# Patient Record
Sex: Female | Born: 1965 | Race: White | Hispanic: No | Marital: Married | Smoking: Never smoker
Health system: Southern US, Community
[De-identification: ages and names within clinical notes are randomized; demographics above are authoritative.]

## PROBLEM LIST (undated history)

## (undated) DIAGNOSIS — Z9889 Other specified postprocedural states: Secondary | ICD-10-CM

## (undated) DIAGNOSIS — E785 Hyperlipidemia, unspecified: Secondary | ICD-10-CM

## (undated) DIAGNOSIS — J302 Other seasonal allergic rhinitis: Secondary | ICD-10-CM

## (undated) DIAGNOSIS — I10 Essential (primary) hypertension: Secondary | ICD-10-CM

## (undated) DIAGNOSIS — G459 Transient cerebral ischemic attack, unspecified: Secondary | ICD-10-CM

## (undated) DIAGNOSIS — F419 Anxiety disorder, unspecified: Secondary | ICD-10-CM

## (undated) DIAGNOSIS — K219 Gastro-esophageal reflux disease without esophagitis: Secondary | ICD-10-CM

## (undated) DIAGNOSIS — E669 Obesity, unspecified: Secondary | ICD-10-CM

## (undated) HISTORY — DX: Anxiety disorder, unspecified: F41.9

## (undated) HISTORY — DX: Gastro-esophageal reflux disease without esophagitis: K21.9

## (undated) HISTORY — DX: Hyperlipidemia, unspecified: E78.5

## (undated) HISTORY — PX: OTHER SURGICAL HISTORY: SHX169

## (undated) HISTORY — DX: Transient cerebral ischemic attack, unspecified: G45.9

## (undated) HISTORY — PX: TONSILLECTOMY: SUR1361

## (undated) HISTORY — DX: Obesity, unspecified: E66.9

---
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
## 2000-10-07 ENCOUNTER — Encounter (INDEPENDENT_AMBULATORY_CARE_PROVIDER_SITE_OTHER): Payer: Self-pay

## 2000-10-07 ENCOUNTER — Other Ambulatory Visit: Admission: RE | Admit: 2000-10-07 | Discharge: 2000-10-07 | Payer: Self-pay | Admitting: Obstetrics and Gynecology

## 2000-10-25 ENCOUNTER — Ambulatory Visit (HOSPITAL_COMMUNITY): Admission: RE | Admit: 2000-10-25 | Discharge: 2000-10-25 | Payer: Self-pay | Admitting: Family Medicine

## 2000-10-25 ENCOUNTER — Encounter: Payer: Self-pay | Admitting: Family Medicine

## 2000-11-07 ENCOUNTER — Encounter (INDEPENDENT_AMBULATORY_CARE_PROVIDER_SITE_OTHER): Payer: Self-pay | Admitting: Specialist

## 2000-11-07 ENCOUNTER — Observation Stay (HOSPITAL_COMMUNITY): Admission: RE | Admit: 2000-11-07 | Discharge: 2000-11-08 | Payer: Self-pay | Admitting: Obstetrics and Gynecology

## 2001-06-19 ENCOUNTER — Encounter: Payer: Self-pay | Admitting: Internal Medicine

## 2001-06-19 ENCOUNTER — Ambulatory Visit (HOSPITAL_COMMUNITY): Admission: RE | Admit: 2001-06-19 | Discharge: 2001-06-19 | Payer: Self-pay | Admitting: Internal Medicine

## 2001-12-01 ENCOUNTER — Other Ambulatory Visit: Admission: RE | Admit: 2001-12-01 | Discharge: 2001-12-01 | Payer: Self-pay | Admitting: Obstetrics and Gynecology

## 2002-12-13 ENCOUNTER — Other Ambulatory Visit: Admission: RE | Admit: 2002-12-13 | Discharge: 2002-12-13 | Payer: Self-pay | Admitting: Obstetrics and Gynecology

## 2003-01-16 ENCOUNTER — Emergency Department (HOSPITAL_COMMUNITY): Admission: EM | Admit: 2003-01-16 | Discharge: 2003-01-16 | Payer: Self-pay | Admitting: Emergency Medicine

## 2003-01-16 ENCOUNTER — Encounter: Payer: Self-pay | Admitting: Emergency Medicine

## 2003-01-17 ENCOUNTER — Encounter: Admission: RE | Admit: 2003-01-17 | Discharge: 2003-01-17 | Payer: Self-pay | Admitting: Obstetrics and Gynecology

## 2003-01-17 ENCOUNTER — Encounter: Payer: Self-pay | Admitting: Obstetrics and Gynecology

## 2003-01-24 ENCOUNTER — Encounter: Payer: Self-pay | Admitting: Family Medicine

## 2003-01-24 ENCOUNTER — Ambulatory Visit (HOSPITAL_COMMUNITY): Admission: RE | Admit: 2003-01-24 | Discharge: 2003-01-24 | Payer: Self-pay | Admitting: Family Medicine

## 2003-11-07 ENCOUNTER — Ambulatory Visit (HOSPITAL_COMMUNITY): Admission: RE | Admit: 2003-11-07 | Discharge: 2003-11-07 | Payer: Self-pay | Admitting: Family Medicine

## 2004-02-17 ENCOUNTER — Ambulatory Visit (HOSPITAL_COMMUNITY): Admission: RE | Admit: 2004-02-17 | Discharge: 2004-02-17 | Payer: Self-pay | Admitting: Obstetrics and Gynecology

## 2004-04-12 HISTORY — PX: ABDOMINAL HYSTERECTOMY: SHX81

## 2004-06-22 ENCOUNTER — Ambulatory Visit (HOSPITAL_COMMUNITY): Admission: RE | Admit: 2004-06-22 | Discharge: 2004-06-22 | Payer: Self-pay | Admitting: Family Medicine

## 2004-06-22 ENCOUNTER — Encounter: Payer: Self-pay | Admitting: Orthopedic Surgery

## 2004-10-14 ENCOUNTER — Emergency Department (HOSPITAL_COMMUNITY): Admission: EM | Admit: 2004-10-14 | Discharge: 2004-10-14 | Payer: Self-pay | Admitting: Emergency Medicine

## 2004-12-04 ENCOUNTER — Ambulatory Visit (HOSPITAL_COMMUNITY): Admission: RE | Admit: 2004-12-04 | Discharge: 2004-12-04 | Payer: Self-pay | Admitting: Family Medicine

## 2005-02-23 ENCOUNTER — Ambulatory Visit (HOSPITAL_COMMUNITY): Admission: RE | Admit: 2005-02-23 | Discharge: 2005-02-23 | Payer: Self-pay | Admitting: Obstetrics and Gynecology

## 2006-02-01 ENCOUNTER — Emergency Department (HOSPITAL_COMMUNITY): Admission: EM | Admit: 2006-02-01 | Discharge: 2006-02-01 | Payer: Self-pay | Admitting: Emergency Medicine

## 2006-02-04 ENCOUNTER — Ambulatory Visit: Payer: Self-pay | Admitting: Internal Medicine

## 2006-02-09 ENCOUNTER — Ambulatory Visit (HOSPITAL_COMMUNITY): Admission: RE | Admit: 2006-02-09 | Discharge: 2006-02-09 | Payer: Self-pay | Admitting: Internal Medicine

## 2006-03-08 ENCOUNTER — Ambulatory Visit (HOSPITAL_COMMUNITY): Admission: RE | Admit: 2006-03-08 | Discharge: 2006-03-08 | Payer: Self-pay | Admitting: Family Medicine

## 2006-04-12 DIAGNOSIS — Z9889 Other specified postprocedural states: Secondary | ICD-10-CM

## 2006-04-12 HISTORY — DX: Other specified postprocedural states: Z98.890

## 2006-06-10 ENCOUNTER — Inpatient Hospital Stay (HOSPITAL_COMMUNITY): Admission: EM | Admit: 2006-06-10 | Discharge: 2006-06-14 | Payer: Self-pay | Admitting: Emergency Medicine

## 2006-06-13 ENCOUNTER — Encounter: Payer: Self-pay | Admitting: Cardiovascular Disease

## 2006-06-13 HISTORY — PX: CARDIAC CATHETERIZATION: SHX172

## 2007-03-23 ENCOUNTER — Ambulatory Visit (HOSPITAL_COMMUNITY): Admission: RE | Admit: 2007-03-23 | Discharge: 2007-03-23 | Payer: Self-pay | Admitting: Obstetrics and Gynecology

## 2007-05-15 ENCOUNTER — Ambulatory Visit: Payer: Self-pay | Admitting: Orthopedic Surgery

## 2007-05-15 DIAGNOSIS — M25569 Pain in unspecified knee: Secondary | ICD-10-CM

## 2008-08-06 ENCOUNTER — Ambulatory Visit (HOSPITAL_COMMUNITY): Admission: RE | Admit: 2008-08-06 | Discharge: 2008-08-06 | Payer: Self-pay | Admitting: Obstetrics and Gynecology

## 2009-06-10 ENCOUNTER — Encounter: Admission: RE | Admit: 2009-06-10 | Discharge: 2009-06-10 | Payer: Self-pay | Admitting: Otolaryngology

## 2010-01-21 ENCOUNTER — Ambulatory Visit (HOSPITAL_COMMUNITY): Admission: RE | Admit: 2010-01-21 | Discharge: 2010-01-21 | Payer: Self-pay | Admitting: Family Medicine

## 2010-05-03 ENCOUNTER — Encounter: Payer: Self-pay | Admitting: Family Medicine

## 2010-05-03 ENCOUNTER — Encounter: Payer: Self-pay | Admitting: Obstetrics and Gynecology

## 2010-08-28 NOTE — Cardiovascular Report (Signed)
NAMEMARTYNA, THORNS NO.:  [id number]   MEDICAL RECORD NO.:  [id number]          PATIENT TYPE:  INP   LOCATION:  2003                         FACILITY:  [facility]   PHYSICIAN:  Nicki Guadalajara, M.D.     DATE OF BIRTH:  [birth year]   DATE OF PROCEDURE:  DATE OF DISCHARGE:                            CARDIAC CATHETERIZATION   INDICATIONS:  Leah Henson is a 45 year old female who has a  history of obesity who was admitted in transfer from [facility]  with recurrent chest pressure, diaphoresis, and transient hypotension  which was nitrate responsive.  Cardiac enzymes have been negative.  Definitive diagnostic cardiac catheterization was recommended.   PROCEDURE:  After premedication with Versed 3 mg intravenously, the  patient was prepped and draped in the usual fashion.  Her right femoral  artery was punctured anteriorly, and a 5-French sheath was inserted  without difficulty.  Diagnostic catheterization was done utilizing 5-  J[place]ica Judkins for left and right coronary catheters.  A pigtail  catheter was used for RAO ventriculography as well as distal  aortography.  Hemostasis was obtained by direct manual pressure.  The  patient tolerated the procedure well and returned to her room in  satisfactory condition.   HEMODYNAMIC DATA:  Central aortic pressure is 115/74, mean 94, left  ventricular pressure 115/6, post A-wave 13.   ANGIOGRAPHIC DATA:  Left main coronary was angiographically normal and  bifurcated into an LAD and left circumflex system.   The LAD was angiographically normal, gave rise to several septal  perforating arteries and several small diagonal vessels.   The left circumflex coronary artery was angiographically normal, gave  rise to one major marginal vessel, and was free of significant disease.   The right coronary artery was a large-caliber dominant vessel that  supplied a large PDA and large posterolateral system.  The RCA and  its  branches were normal.   RAO ventriculography revealed hyperdynamic LV function, with ejection  fraction greater than 65%.  There were no focal segmental wall motion  abnormalities.   Distal aortography revealed a normal aortoiliac system.  There is no  evidence for renal artery stenosis.   IMPRESSION:  1. Normal left ventricular function.  2. Normal coronary arteries.   DISCUSSION:  [name] was admitted to [facility] on  June 10, 2006 with left chest, jaw pain radiating to the medial  aspect of her left arm.  She developed recurrent chest pain on Sunday  June 12, 2006, leading to her [facility] transfer.  Catheterization reveals normal coronary arteries.  There is some history  of possible gallbladder issues.  A gallbladder ultrasound will be  obtained prior to possible discharge later today.           ______________________________  Nicki Guadalajara, M.D.     TK/MEDQ  D:  06/13/2006  T:  06/13/2006  Job:  [id number]   cc:   Kirk Ruths, M.D.  Patrica Duel, M.D.

## 2010-08-28 NOTE — H&P (Signed)
Leah Henson, Leah Henson            ACCOUNT NO.:  [id number]   MEDICAL RECORD NO.:  [id number]          PATIENT TYPE:  INP   LOCATION:  2003                         FACILITY:  [facility]   PHYSICIAN:  Abelino Derrick, P.A.   DATE OF BIRTH:  [birth year]   DATE OF ADMISSION:  06/12/2006  DATE OF DISCHARGE:                              HISTORY & PHYSICAL   CHIEF COMPLAINT:  Chest pain.   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  She had seen Dr. gamble  over 10 years ago.  There is no documented history of prior cardiac  testing.  The patient was in her usual state of health until Friday,  when she developed some chest tightness that went through left shoulder  down her left elbow.  This was Friday afternoon about 2:30.  She took  some aspirin and her symptoms eventually subsided.  Friday night, she  had recurrent pain that was more severe associated with shortness of  breath.  She presented to [facility] ER.  Initial enzymes were negative.  D-dimer was normal.  CT scan of her chest was negative for pulmonary  embolism.  EKG was without acute changes.  She was admitted overnight  for observation.  Plan was to let her go home today, and she was doing  better, but she has had recurrent chest pain.  Her pain now is nitrate  responsive.  She did have some hypotension.  After having chest pain  with nitro this morning she is not transferred to [facility] for further  evaluation.   PAST MEDICAL HISTORY:  Remarkable for previous total abdominal  hysterectomy.  She has had a history of some anxiety and is on Zoloft.  She has had no other major surgeries.  There is no history of  hypertension, diabetes or dyslipidemia.   CURRENT MEDICATIONS:  Are on Zoloft 50 mg a day and an over-the-counter  or hormone preparation.   ALLERGIES:  SHE IS INTOLERANT TO  CODEINE.   SOCIAL HISTORY:  She is married.  She is two children 72 and 57 years  old.  She works as an Psychiatric nurse.  She is a nonsmoker.   FAMILY HISTORY:  Is remarkable for coronary disease, her father had an  MI at 56 and died at 85.  Her mother has history of aortic stenosis.  She has two sisters and a brother.  Neither have coronary disease.   REVIEW OF SYSTEMS:  Essentially unremarkable except for above.  She did  mention some recent weight gain, although she says her TSH has  previously been  normal.   PHYSICAL EXAM:  VITAL SIGNS:  Blood pressure 95/60, pulse 84,  temperature 97, respirations 12.  GENERAL:  She is a well-developed, well-nourished female in no acute  distress.  HEENT:  Normocephalic.  Extraocular movements are intact.  Sclerae is  nonicteric.  Lids and conjunctivae are within normal limits.  NECK:  Without JVD or bruit.  CHEST:  Clear  to auscultation and percussion.  CARDIAC:  Exam reveals a regular rate and rhythm without murmur, rub or  gallop.  Normal S1, S2.  ABDOMEN:  Nontender, no hepatosplenomegaly.  Bowel sounds are present.  EXTREMITIES:  Without edema.  Distal pulses are intact.  NEURO:  Exam grossly intact.  She is awake, alert, oriented and  cooperative, moves all extremities without obvious deficit.  SKIN:  Warm and dry.   IMPRESSION:  1. Chest pain consistent with unstable angina.  2. Transient hypotension this morning.  3. Family history of coronary disease.  4. History of anxiety.   PLAN:  The patient was admitted to telemetry.  Will continue full-dose  Lovenox and aspirin and added PPI and a short course of nonsteroidal  anti-inflammatory.  She will be set up for diagnostic catheterization  tomorrow.      Abelino Derrick, P.Memory Dance  D:  06/12/2006  T:  06/12/2006  Job:  [id number]

## 2010-08-28 NOTE — H&P (Signed)
Leah Henson, Leah Henson            ACCOUNT NO.:  [id number]   MEDICAL RECORD NO.:  [id number]          PATIENT TYPE:  INP   LOCATION:  A208                          FACILITY:  [facility]   PHYSICIAN:  Patrica Duel, M.D.    DATE OF BIRTH:  12-17-1965   DATE OF ADMISSION:  06/10/2006  DATE OF DISCHARGE:  LH                              HISTORY & PHYSICAL   CHIEF COMPLAINT:  Chest pain.   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  She is a nonsmoker, nondrinker  and leads a healthy lifestyle.   The patient presented to the emergency department with the acute onset  of left anterior chest pain with some radiation to the left scapula,  neck and left arm with swelling of the third and fourth digits of the  left hand.  She had no palpitations, syncope, significant dyspnea,  nausea or vomiting.   Emergency department evaluation was benign.  EKG was nonacute.  Cardiac  enzymes were negative, and chest x-ray revealed no acute cardiopulmonary  disease.  D-dimer was normal at 0.23.   The patient was admitted for chest pain of questionable etiology.   Upon further questioning, the patient states she has had a cold  manifested by nasal congestion and some cough but no rigor or dyspnea.  She has been treating herself with over-the-counter medications and has  improved.   Currently, the patient denies significant cough, hemoptysis, headache,  neurologic deficits, genitourinary symptoms, melena, hematemesis,  hematochezia.   There has been no history of any recent exertional dyspnea, chest pain  or other anginal equivalents.   CURRENT MEDICATIONS:  Include Zoloft 50 mg a day and an over-the-counter  hormone pill.   ALLERGIES:  VICODIN.   PAST HISTORY:  As noted above.   FAMILY HISTORY:  Is significant for coronary disease in her father as  well as her  mother in their 49s.   REVIEW OF SYSTEMS:  Is negative except as mentioned.   SOCIAL HISTORY:  As noted above   PHYSICAL EXAMINATION:  A very pleasant fully alert female who is in no  acute distress at this time.  PRESENTING VITAL SIGNS:  Temperature 97.6, BP 110/70, pulse 90 and  regular, respirations 18, O2 saturation 98%.  HEENT:  Normocephalic and atraumatic.  Pupils are equal.  Ears, nose,  throat benign.  NECK:  Is supple.  No bruits, thyromegaly, lymphadenopathy or masses  noted.  LUNGS:  Clear to AP.  There is mild chest wall tenderness in left  costochondral region.  HEART:  Sounds are normal without murmurs, rubs or gallops.  ABDOMEN:  Is nontender, nondistended.  Bowel sounds intact.  EXTREMITIES:  No clubbing, cyanosis or edema.  NEUROLOGIC:  Nonfocal.   ASSESSMENT:  Chest pain, somewhat atypical for anginal syndrome.  She  does have some symptoms compatible with cervical radiculopathy.  D-dimer  and cardiac enzymes well as EKG currently normal.   PLAN:  Complete rule out MI protocol.  Will discharge if negative as  there is low likelihood of significant coronary disease at this point.  Will follow and treat expectantly.  Continue aspirin and p.r.n.  medications.      Patrica Duel, M.D.  Electronically Signed     MC/MEDQ  D:  06/11/2006  T:  06/11/2006  Job:  [id number]

## 2010-08-28 NOTE — Discharge Summary (Signed)
NAMECARY, WILFORD            ACCOUNT NO.:  [id number]   MEDICAL RECORD NO.:  [id number]          PATIENT TYPE:  INP   LOCATION:  2003                         FACILITY:  [facility]   PHYSICIAN:  Patrica Duel, M.D.    DATE OF BIRTH:  May 04, 1965   DATE OF ADMISSION:  06/10/2006  DATE OF DISCHARGE:  03/04/2008LH                               DISCHARGE SUMMARY   DISCHARGE DIAGNOSES:  1. Chest pain of questionable etiology, rule out myocardial infarction      protocol negative and computed tomography scan of chest (spiral)      negative.  2. History of hysterectomy.  3. Mild depression and anxiety.   HISTORY OF PRESENT ILLNESS:  For details regarding admission, please  refer to the admitting note.  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  She had no  palpitations, syncope, significant dyspnea, nausea or vomiting.  Her  initial workup was benign.  She was admitted with chest pain,  questionable etiology.   COURSE IN THE HOSPITAL:  The patient's rule out MI protocol was  negative.  Her CT scan was negative as well.  She developed increasingly  severe pain and was transferred to [facility] for possible  catheterization despite negative preliminary workup.  Dr. Elsie Lincoln  accepted the patient for further workup.   DISPOSITION:  As per [facility] physicians.  Will follow and treat  the patient upon return.      Patrica Duel, M.D.  Electronically Signed     MC/MEDQ  D:  07/11/2006  T:  07/11/2006  Job:  [id number]

## 2010-08-28 NOTE — Discharge Summary (Signed)
[name], CRADY NO.:  [id number]   MEDICAL RECORD NO.:  [id number]          PATIENT TYPE:  INP   LOCATION:  2003                         FACILITY:  [facility]   PHYSICIAN:  Nicki Guadalajara, M.D.     DATE OF BIRTH:  05-25-1965   DATE OF ADMISSION:  DATE OF DISCHARGE:  06/14/2006                               DISCHARGE SUMMARY   DATE OF ADMISSION:  June 12, 2006   DATE OF DISCHARGE:  June 14, 2006   DIAGNOSES:  1. Chest pain.  2. Cardiac catheterization revealing normal coronary arteries.  3. Normal ejection fraction, ejection fraction greater than 65%.  4. Family history of coronary artery disease.   PROCEDURE:  Left heart catheterization and coronary arteriogram were  performed on June 13, 2006, by Dr. Daphene Jaeger.  Coronary arteries  revealed no evidence of disease, and the ejection fraction was normal  with an EF greater than 65%.   COMPLICATIONS:  None.   BRIEF HISTORY:  Leah Henson is a pleasant 45 year old white female  with a history of depression and anxiety, who presented to the emergency  department on the date of admission with left anterior chest discomfort  with radiation to the neck, arm, and scapula.  Initial cardiac enzymes  were negative, and chest x-ray revealed no acute changes.  EKG revealed  no ischemic abnormalities.  She ruled out for myocardial infarction.  However, she underwent a left heart catheterization and coronary  arteriogram on June 13, 2006 by Dr. Tresa Endo with above findings.  She  tolerated the procedure without complication.  However, later in the  day, post procedure, she developed chest pain which she described as a  sharp pressure sensation.  Gallbladder ultrasound results were obtained  at that time which revealed no gallstones.  However, there was a small  left pleural effusion.  She has had a recent upper respiratory  infection.  NSAIDs were started, and proton pump inhibitors increased.  With this chest discomfort,  there were no changes on her EKG.  On the  day of discharge, she denies any pain at rest.  She does have a sharp  sensation in the left anterior chest with deep inspiration most  consistent with pleurisy, which she has had in the past.  She is  ambulating in the room without difficulty, and the right groin site  bandage was removed.  There was no hematoma, no bruit.  There is a very  small amount of ecchymosis.  She has no complaints this morning.  We  will plan for discharge later this morning.   DISCHARGE INSTRUCTIONS:  She is to avoid strenuous physical activity and  no heavy lifting for the next two weeks.  She is to avoid driving for  the next two days.  She is to increase her activity slowly and may walk  up steps using her left foot first.  She is to keep the right groin site  clean and dry and call us with any redness, swelling, or drainage from  the site.  She is to follow up with Dr. Tresa Endo in 3-4 weeks. Our office  will contact her with a specific date and time.   DISCHARGE MEDICATIONS:  Zoloft 50 mg daily, calcium 600 mg 2 times a  day, Nexium 40 mg 2 times a day, and Naprosyn 500 mg 2 times a day for  the next five days.     ______________________________  Leah Muff, NP    ______________________________  Nicki Guadalajara, M.D.    LS/MEDQ  D:  06/14/2006  T:  06/14/2006  Job:  [id number]   cc:   [facility]  Patrica Duel, M.D.

## 2010-08-28 NOTE — Discharge Summary (Signed)
[facility]  Patient:    Leah Henson, Leah Henson                     MRN: [id number] Adm. Date:  [id number] Disc. Date: [id number] Attending:  Lenoard Aden                           Discharge Summary  HOSPITAL COURSE:              The patient underwent uncomplicated LAVH/BSO on November 07, 2000. Her postoperative course was uncomplicated. She was discharged to home on day #1.  DISCHARGE MEDICATIONS:        Tylox, #30, given.  DISCHARGE INSTRUCTIONS:       Discharge teaching done.  DISCHARGE FOLLOWUP:           The patient is to follow up in the office in four to six weeks. DD:  11/26/00 TD:  11/27/00 Job: [id number]

## 2011-05-06 ENCOUNTER — Emergency Department (HOSPITAL_COMMUNITY)
Admission: EM | Admit: 2011-05-06 | Discharge: 2011-05-06 | Disposition: A | Discharge status: Home / Self Care | Attending: Emergency Medicine | Admitting: Emergency Medicine

## 2011-05-06 ENCOUNTER — Encounter (HOSPITAL_COMMUNITY): Payer: Self-pay | Admitting: Emergency Medicine

## 2011-05-06 DIAGNOSIS — R51 Headache: Secondary | ICD-10-CM | POA: Insufficient documentation

## 2011-05-06 DIAGNOSIS — R110 Nausea: Secondary | ICD-10-CM | POA: Insufficient documentation

## 2011-05-06 MED ORDER — BUTALBITAL-ASA-CAFF-CODEINE 50-325-40-30 MG PO CAPS: 1.0000 | ORAL_CAPSULE | ORAL | Status: AC | PRN

## 2011-05-06 MED ORDER — PROMETHAZINE HCL 12.5 MG PO TABS
25.0000 mg | ORAL_TABLET | Freq: Once | ORAL | Status: AC
  Administered 2011-05-06: 25 mg via ORAL
  Filled 2011-05-06: qty 1

## 2011-05-06 MED ORDER — HYDROMORPHONE HCL PF 1 MG/ML IJ SOLN
1.0000 mg | Freq: Once | INTRAMUSCULAR | Status: AC
  Administered 2011-05-06: 1 mg via INTRAVENOUS
  Filled 2011-05-06: qty 1

## 2011-05-06 MED ORDER — DIPHENHYDRAMINE HCL 50 MG/ML IJ SOLN
12.5000 mg | Freq: Once | INTRAMUSCULAR | Status: AC
  Administered 2011-05-06: 12.5 mg via INTRAVENOUS
  Filled 2011-05-06: qty 1

## 2011-05-06 MED ORDER — ONDANSETRON HCL 4 MG/2ML IJ SOLN
4.0000 mg | Freq: Once | INTRAMUSCULAR | Status: AC
  Administered 2011-05-06: 4 mg via INTRAVENOUS
  Filled 2011-05-06: qty 2

## 2011-05-06 MED ORDER — ONDANSETRON HCL 4 MG PO TABS: 4.0000 mg | ORAL_TABLET | Freq: Four times a day (QID) | ORAL | Status: AC

## 2011-05-06 MED ORDER — SODIUM CHLORIDE 0.9 % IV SOLN
INTRAVENOUS | Status: DC
Start: 2011-05-06 — End: 2011-05-06
  Administered 2011-05-06 (×2): via INTRAVENOUS

## 2011-05-06 MED ORDER — PROMETHAZINE HCL 12.5 MG PO TABS
ORAL_TABLET | ORAL | Status: AC
  Filled 2011-05-06: qty 1

## 2011-05-06 NOTE — ED Provider Notes (Signed)
Medical screening examination/treatment/procedure(s) were performed by non-physician practitioner and as supervising physician I was immediately available for consultation/collaboration. Kennadi Albany Y.   Gavin Pound. Anuja Manka, MD 05/06/11 1439

## 2011-05-06 NOTE — ED Provider Notes (Signed)
History     CSN: [id number]  Arrival date & time 05/06/11  0741   First MD Initiated Contact with Patient 05/06/11 [phone]      Chief Complaint  Patient presents with  . Headache    (Consider location/radiation/quality/duration/timing/severity/associated sxs/prior treatment) Patient is a 46 y.o. female presenting with headaches. The history is provided by the patient.  Headache  This is a new problem. The current episode started 3 to 5 hours ago. The problem occurs constantly. The problem has not changed since onset.The headache is associated with bright light and straining. The pain is located in the temporal and parietal region. The quality of the pain is described as sharp and throbbing. The pain is severe. Associated symptoms include malaise/fatigue and nausea. Pertinent negatives include no fever, no near-syncope, no orthopnea, no palpitations, no syncope, no shortness of breath and no vomiting. She has tried nothing for the symptoms.    History reviewed. No pertinent past medical history.  Past Surgical History  Procedure Date  . Abdominal hysterectomy   . Tonsillectomy   . Cardiac catheterization     History reviewed. No pertinent family history.  History  Substance Use Topics  . Smoking status: Never Smoker   . Smokeless tobacco: Not on file  . Alcohol Use: Yes     occasional    OB History    Grav Para Term Preterm Abortions TAB SAB Ect Mult Living                  Review of Systems  Constitutional: Positive for malaise/fatigue. Negative for fever and activity change.       All ROS Neg except as noted in HPI  HENT: Negative for nosebleeds and neck pain.   Eyes: Negative for photophobia and discharge.  Respiratory: Negative for cough, shortness of breath and wheezing.   Cardiovascular: Negative for chest pain, palpitations, orthopnea, syncope and near-syncope.  Gastrointestinal: Positive for nausea. Negative for vomiting, abdominal pain and blood in stool.    Genitourinary: Negative for dysuria, frequency and hematuria.  Musculoskeletal: Negative for back pain and arthralgias.  Skin: Negative.   Neurological: Positive for headaches. Negative for dizziness, seizures and speech difficulty.  Psychiatric/Behavioral: Negative for hallucinations and confusion.    Allergies  Hydrocodone-acetaminophen  Home Medications  No current outpatient prescriptions on file.  BP 134/86  Pulse 88  Temp 97.4 F (36.3 C)  Resp 20  Ht 5\' 4"  (1.626 m)  Wt 183 lb (83.008 kg)  BMI 31.41 kg/m2  SpO2 96%  Physical Exam  Nursing note and vitals reviewed. Constitutional: She is oriented to person, place, and time. She appears well-developed and well-nourished.  Non-toxic appearance.  HENT:  Head: Normocephalic.  Right Ear: Tympanic membrane and external ear normal.  Left Ear: Tympanic membrane and external ear normal.  Eyes: EOM and lids are normal. Pupils are equal, round, and reactive to light.  Neck: Normal range of motion. Neck supple. Carotid bruit is not present.  Cardiovascular: Normal rate, regular rhythm, normal heart sounds, intact distal pulses and normal pulses.   Pulmonary/Chest: Breath sounds normal. No respiratory distress.  Abdominal: Soft. Bowel sounds are normal. There is no tenderness. There is no guarding.  Musculoskeletal: Normal range of motion.  Lymphadenopathy:       Head (right side): No submandibular adenopathy present.       Head (left side): No submandibular adenopathy present.    She has no cervical adenopathy.  Neurological: She is alert and oriented to person, place,  and time. She has normal strength. No cranial nerve deficit or sensory deficit. She exhibits normal muscle tone. Coordination normal. GCS eye subscore is 4. GCS verbal subscore is 5. GCS motor subscore is 6.       Grip symetrical. Speech clear.  Skin: Skin is warm and dry.  Psychiatric: She has a normal mood and affect. Her speech is normal.    ED Course   Procedures (including critical care time)  Labs Reviewed - No data to display No results found.   I have reviewed nursing notes, vital signs, and all appropriate lab and imaging results for this patient.   MDM  I have reviewed nursing notes, vital signs, and all appropriate lab and imaging results for this patient. A 9:39-the patient states that after IV Dilaudid and by mouth Phenergan, she feels a" funny feeling all over her body" she has nausea and generally didn't feel well. IV Benadryl and Zofran ordered for the patient. 11:14 - Headache and nausea much improved. No gross neuro deficits. Rx for zofran and fioricet#3 given. Referred to [facility].      Kathie Dike, [place] 05/06/11 1116

## 2011-05-06 NOTE — ED Notes (Signed)
Pt c/o severe ha with dizziness and nausea since 0638 this am.

## 2011-05-24 ENCOUNTER — Ambulatory Visit (HOSPITAL_COMMUNITY)
Admission: RE | Admit: 2011-05-24 | Discharge: 2011-05-24 | Disposition: A | Discharge status: Home / Self Care | Source: Ambulatory Visit | Attending: Obstetrics and Gynecology | Admitting: Obstetrics and Gynecology

## 2011-05-24 ENCOUNTER — Other Ambulatory Visit (HOSPITAL_COMMUNITY): Payer: Self-pay | Admitting: Obstetrics and Gynecology

## 2011-05-24 DIAGNOSIS — Z1231 Encounter for screening mammogram for malignant neoplasm of breast: Secondary | ICD-10-CM

## 2011-12-30 ENCOUNTER — Emergency Department (HOSPITAL_COMMUNITY)
Admission: EM | Admit: 2011-12-30 | Discharge: 2011-12-30 | Disposition: A | Discharge status: Home / Self Care | Payer: Self-pay | Attending: Emergency Medicine | Admitting: Emergency Medicine

## 2011-12-30 ENCOUNTER — Emergency Department (HOSPITAL_COMMUNITY): Payer: Self-pay

## 2011-12-30 ENCOUNTER — Encounter (HOSPITAL_COMMUNITY): Payer: Self-pay | Admitting: *Deleted

## 2011-12-30 DIAGNOSIS — IMO0002 Reserved for concepts with insufficient information to code with codable children: Secondary | ICD-10-CM | POA: Insufficient documentation

## 2011-12-30 DIAGNOSIS — S8010XA Contusion of unspecified lower leg, initial encounter: Secondary | ICD-10-CM | POA: Insufficient documentation

## 2011-12-30 DIAGNOSIS — Y9229 Other specified public building as the place of occurrence of the external cause: Secondary | ICD-10-CM | POA: Insufficient documentation

## 2011-12-30 DIAGNOSIS — S8012XA Contusion of left lower leg, initial encounter: Secondary | ICD-10-CM

## 2011-12-30 NOTE — ED Provider Notes (Signed)
Medical screening examination/treatment/procedure(s) were performed by non-physician practitioner and as supervising physician I was immediately available for consultation/collaboration.  Cadyn Rodger, MD 12/30/11 2317 

## 2011-12-30 NOTE — ED Provider Notes (Signed)
History     CSN: [id number]  Arrival date & time 12/30/11  4098   First MD Initiated Contact with Patient 12/30/11 1918      Chief Complaint  Patient presents with  . Leg Pain    (Consider location/radiation/quality/duration/timing/severity/associated sxs/prior treatment) HPI Comments: Pt was pushing a shopping cart while walking into a store.  The cart hit an elevated piece of concrete which stopped abruptly and she hit her L shin on the cart.  No other injuries or complaints.  Patient is a 46 y.o. female presenting with leg pain. The history is provided by the patient. No language interpreter was used.  Leg Pain  The incident occurred 1 to 2 hours ago. The injury mechanism was a direct blow. The pain is present in the left leg. The quality of the pain is described as aching. The pain is moderate. The pain has been constant since onset. Pertinent negatives include no numbness. She reports no foreign bodies present. The symptoms are aggravated by bearing weight and palpation. She has tried nothing for the symptoms.    History reviewed. No pertinent past medical history.  Past Surgical History  Procedure Date  . Abdominal hysterectomy   . Tonsillectomy   . Cardiac catheterization     History reviewed. No pertinent family history.  History  Substance Use Topics  . Smoking status: Never Smoker   . Smokeless tobacco: Not on file  . Alcohol Use: Yes     occasional    OB History    Grav Para Term Preterm Abortions TAB SAB Ect Mult Living                  Review of Systems  Musculoskeletal:       Shin injury   Skin: Negative for wound.  Neurological: Negative for weakness and numbness.  All other systems reviewed and are negative.    Allergies  Hydrocodone-acetaminophen  Home Medications   Current Outpatient Rx  Name Route Sig Dispense Refill  . ASPIRIN EC 81 MG PO TBEC Oral Take 81 mg by mouth daily as needed. Pain    . IBUPROFEN 200 MG PO TABS Oral Take 800  mg by mouth every 6 (six) hours as needed. Pain    . ADULT MULTIVITAMIN W/MINERALS CH Oral Take 1 tablet by mouth daily.    Marland Kitchen OVER THE COUNTER MEDICATION Both Eyes Place 1 drop into both eyes 4 (four) times daily. Eye drop      BP 131/93  Pulse 94  Temp 98.2 F (36.8 C) (Oral)  Resp 20  Ht 5\' 4"  (1.626 m)  Wt 185 lb 6 oz (84.086 kg)  BMI 31.82 kg/m2  SpO2 98%  Physical Exam  Nursing note and vitals reviewed. Constitutional: She is oriented to person, place, and time. She appears well-developed and well-nourished. No distress.  HENT:  Head: Normocephalic and atraumatic.  Eyes: EOM are normal.  Neck: Normal range of motion.  Cardiovascular: Normal rate, regular rhythm and normal heart sounds.   Pulmonary/Chest: Effort normal and breath sounds normal.  Abdominal: Soft. She exhibits no distension. There is no tenderness.  Musculoskeletal: She exhibits tenderness.       Left knee: She exhibits decreased range of motion, swelling and ecchymosis. She exhibits no effusion, no deformity, no laceration and no erythema.       Legs: Neurological: She is alert and oriented to person, place, and time.  Skin: Skin is warm and dry.  Psychiatric: She has a normal mood  and affect. Judgment normal.    ED Course  Procedures (including critical care time)  Labs Reviewed - No data to display Dg Tibia/fibula Left  12/30/2011  *RADIOLOGY REPORT*  Clinical Data: Left lower leg pain.  LEFT TIBIA AND FIBULA - 2 VIEW  Comparison: None available.  Findings: The knee and ankle joints are located.  Pretibial soft tissue swelling is present proximally.  There is no underlying fracture.  No radiopaque foreign body is evident.  IMPRESSION: Pretibial soft tissue swelling without underlying fracture or radiopaque foreign body.   Original Report Authenticated By: Jamesetta Orleans. MATTERN, M.D.      1. Contusion of left lower leg       MDM  No fxs  Ice, elevation, ibuprofen, crutches F/u with dr. Romeo Apple  prn        Evalina Field, PA 12/30/11 2034

## 2011-12-30 NOTE — ED Notes (Signed)
Discharge instructions reviewed with pt, questions answered. Pt verbalized understanding.  

## 2011-12-30 NOTE — ED Notes (Addendum)
Lt lower leg pain, Hit lt leg against cart.  Hurts to walk, contusion present.

## 2012-01-12 ENCOUNTER — Ambulatory Visit (INDEPENDENT_AMBULATORY_CARE_PROVIDER_SITE_OTHER): Admitting: Orthopedic Surgery

## 2012-01-12 ENCOUNTER — Encounter: Payer: Self-pay | Admitting: Orthopedic Surgery

## 2012-01-12 VITALS — BP 102/68 | Ht 64.0 in | Wt 186.0 lb

## 2012-01-12 DIAGNOSIS — S8010XA Contusion of unspecified lower leg, initial encounter: Secondary | ICD-10-CM | POA: Insufficient documentation

## 2012-01-12 NOTE — Patient Instructions (Addendum)
Heat as needed  Continue ibuprofen as needed  Should be self limiting  Bone contusion

## 2012-01-12 NOTE — Progress Notes (Signed)
Patient ID: Leah Henson, female   DOB: January 21, 1966, 46 y.o.   MRN: [id number] Chief Complaint  Patient presents with  . Leg Pain    Left leg pain, DOI 12-30-11.    Pain LEFT shin secondary to shopping cart rolling down a hill and hitting her LEFT tibia. X-ray was negative. Injury was September 19. Pain is still persisting. She's tried taking ibuprofen. But she still having sharp, stabbing, burning 5/10 pain, which is worse with ambulation.  Review of systems is positive for weight gain and otherwise negative.  The patient's allergies are recorded, the medical and surgical history have been recorded, medications family history and social history have been recorded and all have been reviewed.  BP 102/68  Ht 5\' 4"  (1.626 m)  Wt 186 lb (84.369 kg)  BMI 31.93 kg/m2  Vital signs are stable as recorded  General appearance is normal  The patient is alert and oriented x3  The patient's mood and affect are normal  Gait assessment: slightly abnormal   The cardiovascular exam reveals normal pulses and temperature   The sensory exam is normal.  There are no pathologic reflexes.  Balance is normal.  Exam of the left tibia  Inspection:  swelling of the left lower leg with painful ankle ROM. Ankle stability normal. Skin intact , small subQ hematoma.   Bone contusion and subQ hematoma  Xray review + report negative   Self Limiting him. Plan six-day week before pain subsides

## 2012-06-08 ENCOUNTER — Ambulatory Visit (HOSPITAL_COMMUNITY)
Admission: RE | Admit: 2012-06-08 | Discharge: 2012-06-08 | Disposition: A | Discharge status: Home / Self Care | Source: Ambulatory Visit | Attending: Obstetrics and Gynecology | Admitting: Obstetrics and Gynecology

## 2012-06-08 ENCOUNTER — Other Ambulatory Visit (HOSPITAL_COMMUNITY): Payer: Self-pay | Admitting: Obstetrics and Gynecology

## 2012-06-08 DIAGNOSIS — Z1231 Encounter for screening mammogram for malignant neoplasm of breast: Secondary | ICD-10-CM

## 2013-02-06 ENCOUNTER — Ambulatory Visit (INDEPENDENT_AMBULATORY_CARE_PROVIDER_SITE_OTHER): Admitting: Cardiovascular Disease

## 2013-02-06 ENCOUNTER — Encounter: Payer: Self-pay | Admitting: Cardiovascular Disease

## 2013-02-06 VITALS — BP 122/80 | HR 63 | Ht 64.5 in | Wt 189.4 lb

## 2013-02-06 DIAGNOSIS — E669 Obesity, unspecified: Secondary | ICD-10-CM

## 2013-02-06 DIAGNOSIS — K219 Gastro-esophageal reflux disease without esophagitis: Secondary | ICD-10-CM

## 2013-02-06 DIAGNOSIS — F411 Generalized anxiety disorder: Secondary | ICD-10-CM

## 2013-02-06 DIAGNOSIS — F419 Anxiety disorder, unspecified: Secondary | ICD-10-CM

## 2013-02-06 DIAGNOSIS — R079 Chest pain, unspecified: Secondary | ICD-10-CM

## 2013-02-06 DIAGNOSIS — Z79899 Other long term (current) drug therapy: Secondary | ICD-10-CM

## 2013-02-06 DIAGNOSIS — E785 Hyperlipidemia, unspecified: Secondary | ICD-10-CM

## 2013-02-06 DIAGNOSIS — R0789 Other chest pain: Secondary | ICD-10-CM

## 2013-02-06 DIAGNOSIS — R5381 Other malaise: Secondary | ICD-10-CM

## 2013-02-06 NOTE — Patient Instructions (Signed)
Your physician recommends that you schedule a follow-up appointment in: 1 year  Your physician recommends that you return for lab work in: CMP, CBC, NMR LIPIDS,TSH

## 2013-02-15 LAB — NMR LIPOPROFILE WITH LIPIDS
HDL Size: 8.7 nm — ABNORMAL LOW (ref >=9.2)
LDL (calc): 109 mg/dL — ABNORMAL HIGH (ref <100)
LDL Particle Number: 1350 nmol/L — ABNORMAL HIGH (ref <1000)
LDL Size: 20.6 nm (ref >20.5)
LP-IR Score: 63 — ABNORMAL HIGH (ref <=45)
Large VLDL-P: 2 nmol/L (ref <=2.7)
Small LDL Particle Number: 535 nmol/L — ABNORMAL HIGH (ref <=527)
VLDL Size: 51 nm — ABNORMAL HIGH (ref <=46.6)

## 2013-02-15 LAB — CBC
Hemoglobin: 13.5 g/dL (ref 12.0–15.0)
MCH: 28.4 pg (ref 26.0–34.0)
MCV: 84 fL (ref 78.0–100.0)
Platelets: 265 10*3/uL (ref 150–400)
RBC: 4.75 MIL/uL (ref 3.87–5.11)
WBC: 5.7 10*3/uL (ref 4.0–10.5)

## 2013-02-15 LAB — COMPREHENSIVE METABOLIC PANEL
ALT: 24 U/L (ref 0–35)
Albumin: 4.5 g/dL (ref 3.5–5.2)
CO2: 22 mEq/L (ref 19–32)
Calcium: 9.4 mg/dL (ref 8.4–10.5)
Chloride: 109 mEq/L (ref 96–112)
Glucose, Bld: 108 mg/dL — ABNORMAL HIGH (ref 70–99)
Sodium: 142 mEq/L (ref 135–145)
Total Protein: 6.8 g/dL (ref 6.0–8.3)

## 2013-03-10 ENCOUNTER — Encounter: Payer: Self-pay | Admitting: Cardiovascular Disease

## 2013-03-10 DIAGNOSIS — F419 Anxiety disorder, unspecified: Secondary | ICD-10-CM | POA: Insufficient documentation

## 2013-03-10 DIAGNOSIS — E669 Obesity, unspecified: Secondary | ICD-10-CM | POA: Insufficient documentation

## 2013-03-10 DIAGNOSIS — R0789 Other chest pain: Secondary | ICD-10-CM | POA: Insufficient documentation

## 2013-03-10 DIAGNOSIS — K219 Gastro-esophageal reflux disease without esophagitis: Secondary | ICD-10-CM | POA: Insufficient documentation

## 2013-03-10 DIAGNOSIS — E785 Hyperlipidemia, unspecified: Secondary | ICD-10-CM | POA: Insufficient documentation

## 2013-03-10 NOTE — Progress Notes (Signed)
Patient ID: Leah Henson, female   DOB: 11-Oct-1965, 47 y.o.   MRN: [id number]     HPI: Leah Henson is a 47 y.o. female who presents to the office for cardiology evaluation. I last saw her almost 2 years ago in January 2013.  Leah Henson has a history of hyperlipidemia, GERD, mild obesity, and has experienced episodes of somewhat atypical chest pain. She had previously undergone cardiac catheterization in 2008 which showed normal coronary arteries. She does note stress induced chest discomfort without exertional precipitation. She remotely had taken Crestor for hyperlipidemia but is not on a statin since 2010. In February 2012 her LDL cholesterol was 116.  Over the past 2 years, she continues to experience some stress mediated chest discomfort. She denies exertional chest pain. She denies shortness of breath. At times she does note some mild anxiety. She has been prescribed Zoloft. She presents for cardiology evaluation.  History reviewed. No pertinent past medical history.  Past Surgical History  Procedure Laterality Date  . Abdominal hysterectomy    . Tonsillectomy    . Cardiac catheterization      Allergies  Allergen Reactions  . Hydrocodone-Acetaminophen Nausea And Vomiting    Current Outpatient Prescriptions  Medication Sig Dispense Refill  . aspirin EC 81 MG tablet Take 81 mg by mouth daily as needed. Pain      . ibuprofen (ADVIL,MOTRIN) 200 MG tablet Take 800 mg by mouth every 6 (six) hours as needed. Pain      . Multiple Vitamin (MULITIVITAMIN WITH MINERALS) TABS Take 1 tablet by mouth daily.      Marland Kitchen OVER THE COUNTER MEDICATION Place 1 drop into both eyes 4 (four) times daily. Eye drop      . sertraline (ZOLOFT) 25 MG tablet Take 25 mg by mouth daily.       No current facility-administered medications for this visit.    History   Social History  . Marital Status: Married    Spouse Name: N/A    Number of Children: N/A  . Years of Education: N/A    Occupational History  . Not on file.   Social History Main Topics  . Smoking status: Never Smoker   . Smokeless tobacco: Never Used  . Alcohol Use: Yes     Comment: occasional  . Drug Use: No  . Sexual Activity: Not on file   Other Topics Concern  . Not on file   Social History Narrative  . No narrative on file   Additional social history is notable that she is married for 17 years. 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 She does walk approximately 2 days per week for up to 30 minutes. There is no tobacco or alcohol use.  History reviewed. No pertinent family history.  ROS is negative for fevers, chills or night sweats. She denies skin changes. She denies changes in hearing. She denies awareness of lymphadenopathy. She denies cough wheezing or increased sputum production. She denies presyncope or syncope. Rare palpitations. She denies exertionally precipitated chest pain. Times when she does get stressed she does note some atypical short-lived episodes of chest twinges. She denies nausea vomiting or diarrhea. She denies change in bowel or bladder habits. She is unaware of blood in her stool or urine. The past she states she did develop some joint discomfort while on Crestor. She is unaware of diabetes. She is unaware of other endocrine problems. She denies cold heat intolerance. She denies significant  issues with sleep.  Other comprehensive 12 point system review is negative.  PE BP 122/80  Pulse 63  Ht 5' 4.5" (1.638 m)  Wt 189 lb 6.4 oz (85.911 kg)  BMI 32.02 kg/m2  General: Alert, oriented, no distress.  Skin: normal turgor, no rashes HEENT: Normocephalic, atraumatic. Pupils round and reactive; sclera anicteric;no lid lag.  Nose without nasal septal hypertrophy Mouth/Parynx benign; Mallinpatti scale 2/3 Neck: No JVD, no carotid briuts Lungs: clear to ausculatation and percussion; no wheezing or rales Heart: RRR, s1 s2 normal  Abdomen: Mild central  adiposity ; soft, nontender; no hepatosplenomehaly, BS+; abdominal aorta nontender and not dilated by palpation. Pulses 2+ Extremities: no clubbing cyanosis or edema, Homan's sign negative  Neurologic: grossly nonfocal Psychologic: normal affect and mood.  ECG: Normal sinus rhythm at 63 beats per minute.PR 154 ms; QTc interval 429 ms   LABS:  BMET    Component Value Date/Time   NA 142 02/15/2013 0910   K 4.2 02/15/2013 0910   CL 109 02/15/2013 0910   CO2 22 02/15/2013 0910   GLUCOSE 108* 02/15/2013 0910   BUN 15 02/15/2013 0910   CREATININE 0.84 02/15/2013 0910   CALCIUM 9.4 02/15/2013 0910     Hepatic Function Panel     Component Value Date/Time   PROT 6.8 02/15/2013 0910   ALBUMIN 4.5 02/15/2013 0910   AST 25 02/15/2013 0910   ALT 24 02/15/2013 0910   ALKPHOS 78 02/15/2013 0910   BILITOT 0.7 02/15/2013 0910     CBC    Component Value Date/Time   WBC 5.7 02/15/2013 0910   RBC 4.75 02/15/2013 0910   HGB 13.5 02/15/2013 0910   HCT 39.9 02/15/2013 0910   PLT 265 02/15/2013 0910   MCV 84.0 02/15/2013 0910   MCH 28.4 02/15/2013 0910   MCHC 33.8 02/15/2013 0910   RDW 14.1 02/15/2013 0910     BNP No results found for this basename: probnp    Lipid Panel     Component Value Date/Time   TRIG 72 02/15/2013 0910   LDLCALC 109* 02/15/2013 0910     RADIOLOGY: No results found.    ASSESSMENT AND PLAN:  Leah Henson is now 47 years old. She has a history of hyperlipidemia, GERD, mild obesity, and in the past had undergone cardiac catheterization in March 2008 after she was transferred for [facility] with recurrent chest pain for definitive cardiac catheterization. Her coronary arteries were documented to be normal. She does have a history of hyperlipidemia in the past apparently developed some joint discomfort on Crestor. I am recommending repeat laboratory V. tach consisting of a CBC, CMP, and NMR lipoprofile, as well as TSH level. We will contact her regarding the  results and if necessary adjustments may be made in medical management. As long as she remains stable, we'll see her in one year for cardiology reevaluation.    Lennette Bihari, MD, [facility]  03/10/2013 10:23 AM

## 2013-03-12 ENCOUNTER — Encounter: Payer: Self-pay | Admitting: Cardiovascular Disease

## 2013-04-02 ENCOUNTER — Other Ambulatory Visit: Payer: Self-pay | Admitting: *Deleted

## 2013-04-02 MED ORDER — EZETIMIBE 10 MG PO TABS: 10.0000 mg | ORAL_TABLET | Freq: Every day | ORAL | Status: DC

## 2013-07-26 ENCOUNTER — Emergency Department (HOSPITAL_COMMUNITY)

## 2013-07-26 ENCOUNTER — Other Ambulatory Visit: Payer: Self-pay

## 2013-07-26 ENCOUNTER — Encounter (HOSPITAL_COMMUNITY): Payer: Self-pay | Admitting: Emergency Medicine

## 2013-07-26 ENCOUNTER — Inpatient Hospital Stay (HOSPITAL_COMMUNITY)
Admission: EM | Admit: 2013-07-26 | Discharge: 2013-07-27 | DRG: 690 | Disposition: A | Discharge status: Home / Self Care | Attending: Internal Medicine | Admitting: Internal Medicine

## 2013-07-26 DIAGNOSIS — R4182 Altered mental status, unspecified: Secondary | ICD-10-CM

## 2013-07-26 DIAGNOSIS — Z7982 Long term (current) use of aspirin: Secondary | ICD-10-CM

## 2013-07-26 DIAGNOSIS — R4789 Other speech disturbances: Secondary | ICD-10-CM | POA: Diagnosis present

## 2013-07-26 DIAGNOSIS — R5383 Other fatigue: Secondary | ICD-10-CM | POA: Diagnosis present

## 2013-07-26 DIAGNOSIS — F419 Anxiety disorder, unspecified: Secondary | ICD-10-CM

## 2013-07-26 DIAGNOSIS — R5381 Other malaise: Secondary | ICD-10-CM

## 2013-07-26 DIAGNOSIS — R4781 Slurred speech: Secondary | ICD-10-CM | POA: Diagnosis present

## 2013-07-26 DIAGNOSIS — N390 Urinary tract infection, site not specified: Principal | ICD-10-CM | POA: Diagnosis present

## 2013-07-26 DIAGNOSIS — R001 Bradycardia, unspecified: Secondary | ICD-10-CM

## 2013-07-26 DIAGNOSIS — K219 Gastro-esophageal reflux disease without esophagitis: Secondary | ICD-10-CM | POA: Diagnosis present

## 2013-07-26 DIAGNOSIS — I498 Other specified cardiac arrhythmias: Secondary | ICD-10-CM | POA: Diagnosis present

## 2013-07-26 DIAGNOSIS — E669 Obesity, unspecified: Secondary | ICD-10-CM | POA: Diagnosis present

## 2013-07-26 DIAGNOSIS — Z885 Allergy status to narcotic agent status: Secondary | ICD-10-CM

## 2013-07-26 DIAGNOSIS — Z6832 Body mass index (BMI) 32.0-32.9, adult: Secondary | ICD-10-CM

## 2013-07-26 DIAGNOSIS — E785 Hyperlipidemia, unspecified: Secondary | ICD-10-CM | POA: Diagnosis present

## 2013-07-26 LAB — LIPASE, BLOOD: Lipase: 30 U/L (ref 11–59)

## 2013-07-26 LAB — HEPATIC FUNCTION PANEL
ALBUMIN: 4.1 g/dL (ref 3.5–5.2)
ALK PHOS: 78 U/L (ref 39–117)
ALT: 28 U/L (ref 0–35)
AST: 26 U/L (ref 0–37)
BILIRUBIN TOTAL: 0.4 mg/dL (ref 0.3–1.2)
Bilirubin, Direct: <0.2 mg/dL (ref 0.0–0.3)
TOTAL PROTEIN: 6.8 g/dL (ref 6.0–8.3)

## 2013-07-26 LAB — CBC
HCT: 39.5 % (ref 36.0–46.0)
HEMATOCRIT: 37.5 % (ref 36.0–46.0)
HEMOGLOBIN: 13.1 g/dL (ref 12.0–15.0)
Hemoglobin: 12.4 g/dL (ref 12.0–15.0)
MCH: 29 pg (ref 26.0–34.0)
MCH: 28.9 pg (ref 26.0–34.0)
MCHC: 33.2 g/dL (ref 30.0–36.0)
MCHC: 33.1 g/dL (ref 30.0–36.0)
MCV: 87.4 fL (ref 78.0–100.0)
MCV: 87.4 fL (ref 78.0–100.0)
PLATELETS: 282 10*3/uL (ref 150–400)
Platelets: 246 10*3/uL (ref 150–400)
RBC: 4.52 MIL/uL (ref 3.87–5.11)
RBC: 4.29 MIL/uL (ref 3.87–5.11)
RDW: 13.5 % (ref 11.5–15.5)
RDW: 13.5 % (ref 11.5–15.5)
WBC: 8.6 10*3/uL (ref 4.0–10.5)
WBC: 7.2 10*3/uL (ref 4.0–10.5)

## 2013-07-26 LAB — TROPONIN I: Troponin I: <0.3 ng/mL (ref <0.30)

## 2013-07-26 LAB — URINALYSIS, ROUTINE W REFLEX MICROSCOPIC
Bilirubin Urine: NEGATIVE
GLUCOSE, UA: NEGATIVE mg/dL
Hgb urine dipstick: NEGATIVE
KETONES UR: NEGATIVE mg/dL
NITRITE: NEGATIVE
PH: 7.5 (ref 5.0–8.0)
Protein, ur: NEGATIVE mg/dL
SPECIFIC GRAVITY, URINE: 1.025 (ref 1.005–1.030)
Urobilinogen, UA: 0.2 mg/dL (ref 0.0–1.0)

## 2013-07-26 LAB — BASIC METABOLIC PANEL
BUN: 14 mg/dL (ref 6–23)
CO2: 23 meq/L (ref 19–32)
Calcium: 9.3 mg/dL (ref 8.4–10.5)
Chloride: 102 mEq/L (ref 96–112)
Creatinine, Ser: 0.75 mg/dL (ref 0.50–1.10)
GFR calc Af Amer: >90 mL/min (ref >90)
GLUCOSE: 127 mg/dL — AB (ref 70–99)
POTASSIUM: 4.1 meq/L (ref 3.7–5.3)
SODIUM: 140 meq/L (ref 137–147)

## 2013-07-26 LAB — RAPID URINE DRUG SCREEN, HOSP PERFORMED
Amphetamines: NOT DETECTED
Barbiturates: NOT DETECTED
Benzodiazepines: NOT DETECTED
Cocaine: NOT DETECTED
OPIATES: NOT DETECTED
Tetrahydrocannabinol: NOT DETECTED

## 2013-07-26 LAB — URINE MICROSCOPIC-ADD ON

## 2013-07-26 LAB — I-STAT TROPONIN, ED: Troponin i, poc: 0 ng/mL (ref 0.00–0.08)

## 2013-07-26 LAB — CREATININE, SERUM
Creatinine, Ser: 0.71 mg/dL (ref 0.50–1.10)
GFR calc Af Amer: >90 mL/min (ref >90)

## 2013-07-26 LAB — I-STAT CG4 LACTIC ACID, ED: Lactic Acid, Venous: 0.84 mmol/L (ref 0.5–2.2)

## 2013-07-26 LAB — CBG MONITORING, ED: GLUCOSE-CAPILLARY: 71 mg/dL (ref 70–99)

## 2013-07-26 LAB — AMMONIA: Ammonia: 16 umol/L (ref 11–60)

## 2013-07-26 LAB — TSH: TSH: 3.35 u[IU]/mL (ref 0.350–4.500)

## 2013-07-26 MED ORDER — DEXTROSE 5 % IV SOLN
1.0000 g | INTRAVENOUS | Status: DC
  Administered 2013-07-27: 1 g via INTRAVENOUS
  Filled 2013-07-26: qty 10

## 2013-07-26 MED ORDER — ENOXAPARIN SODIUM 40 MG/0.4ML ~~LOC~~ SOLN
40.0000 mg | SUBCUTANEOUS | Status: DC
Start: 2013-07-26 — End: 2013-07-27
  Administered 2013-07-26: 40 mg via SUBCUTANEOUS
  Filled 2013-07-26 (×3): qty 0.4

## 2013-07-26 MED ORDER — SODIUM CHLORIDE 0.9 % IV BOLUS (SEPSIS)
500.0000 mL | Freq: Once | INTRAVENOUS | Status: AC
  Administered 2013-07-26: 500 mL via INTRAVENOUS

## 2013-07-26 MED ORDER — SODIUM CHLORIDE 0.9 % IV SOLN
INTRAVENOUS | Status: DC
  Administered 2013-07-26 – 2013-07-27 (×2): via INTRAVENOUS

## 2013-07-26 MED ORDER — ONDANSETRON HCL 4 MG/2ML IJ SOLN: 4.0000 mg | Freq: Four times a day (QID) | INTRAMUSCULAR | Status: DC | PRN

## 2013-07-26 MED ORDER — SODIUM CHLORIDE 0.9 % IV SOLN
INTRAVENOUS | Status: DC
  Administered 2013-07-26: 15:00:00 via INTRAVENOUS

## 2013-07-26 MED ORDER — SODIUM CHLORIDE 0.9 % IV SOLN
INTRAVENOUS | Status: AC
  Administered 2013-07-26: 20:00:00 via INTRAVENOUS

## 2013-07-26 MED ORDER — ONDANSETRON HCL 4 MG PO TABS: 4.0000 mg | ORAL_TABLET | Freq: Four times a day (QID) | ORAL | Status: DC | PRN

## 2013-07-26 MED ORDER — DEXTROSE 5 % IV SOLN
1.0000 g | Freq: Once | INTRAVENOUS | Status: AC
  Administered 2013-07-26: 1 g via INTRAVENOUS
  Filled 2013-07-26: qty 10

## 2013-07-26 NOTE — ED Notes (Signed)
Pt refusing to go to MRI stating that she does not have facial numbness and that she no longer needs the MRI. She states that she wants Dr. Tresa EndoKelly to be called and informed of her symptoms. Pt informed of the risks and benefits of refusal of the MRI and states that she understands. Pt A&Ox4 and able to make healthcare decisions. EDP aware of patient's refusal and states that he will go talk with her.

## 2013-07-26 NOTE — Progress Notes (Signed)
ANTIBIOTIC CONSULT NOTE - INITIAL  Pharmacy Consult for Rocephin Indication: UTI  Allergies  Allergen Reactions  . Hydrocodone-Acetaminophen Nausea And Vomiting    Patient Measurements: Height: 5\' 4"  (162.6 cm) Weight: 188 lb 7.9 oz (85.5 kg) IBW/kg (Calculated) : 54.7 Adjusted Body Weight:   Vital Signs: Temp: 97.9 F (36.6 C) (04/16 2037) Temp src: Oral (04/16 2037) BP: 96/57 mmHg (04/16 2037) Pulse Rate: 67 (04/16 2037) Intake/Output from previous day:   Intake/Output from this shift:    Labs:  Recent Labs  07/26/13 1215  WBC 8.6  HGB 13.1  PLT 282  CREATININE 0.75   Estimated Creatinine Clearance: 92 ml/min (by C-G formula based on Cr of 0.75). No results found for this basename: VANCOTROUGH, VANCOPEAK, VANCORANDOM, GENTTROUGH, GENTPEAK, GENTRANDOM, TOBRATROUGH, TOBRAPEAK, TOBRARND, AMIKACINPEAK, AMIKACINTROU, AMIKACIN,  in the last 72 hours   Microbiology: No results found for this or any previous visit (from the past 720 hour(s)).  Medical History: History reviewed. No pertinent past medical history.  Medications:  Scheduled:  . sodium chloride   Intravenous STAT  . enoxaparin (LOVENOX) injection  40 mg Subcutaneous Q24H   Assessment: 48yo female with AMS and slurring of speech; MRI and CT of head are negative.  Pt is to start Rocephin for UTI; she received a dose in the ED at 3PM  Goal of Therapy:  resolution of infection  Plan:  1-  Rocephin 1gm IV q24, next dose at 3PM on 4/17 2-  Pharmacy will d/c follow-up; please reconsult as needed.  Marisue HumbleKendra Audreyana Huntsberry, PharmD Clinical Pharmacist [facility]

## 2013-07-26 NOTE — ED Notes (Signed)
Spoke to lab about adding on hepatic function test and lipase to lab work already in the lab. Leah Henson stated that there was enough blood there to run both tests.

## 2013-07-26 NOTE — H&P (Signed)
[facility] History and Physical  GLENDOLA FRIEDHOFF ZOX:[id number] DOB: 10-Jun-1965 DOA: 07/26/2013  Referring physician: ER physician. PCP: Colette Ribas, MD   Chief Complaint: Lethargy and slurred speech.  HPI: Leah Henson is a 48 y.o. female with history of hyperlipidemia and GERD was brought to the ER patient was found to be in increasingly lethargic and having slurred speech. Patient last night had sudden gush of blood flowing sensation in her head following which she also has some chest pressure which resolved by itself after a few minutes. Today morning patient was getting prepared to take her friends to the ophthalmologist when she started feeling some numbness of the face and tingling in the upper extremities. She reached ophthalmologist office when she was looking tired and ophthalmologist office offered her something to eat. Later patient family found her lethargic and easily falling asleep and patient also had slurred speech. Patient did not have any difficulty moving her lower extremities. Patient was brought to the ER and was found to be mildly bradycardic with blood pressure in the 90s systolic. CT head MRI brain did not show anything acute. EKG showed sinus bradycardia. Patient has been recently taking a diet pill called Garcinia Cambogia and also had recently change her multivitamin pills 2 days ago. Patient at this time denies any chest pain or shortness of breath. On my exam patient has become more alert awake and states she feels much better. Patient's husband who was at the bedside states she is much better from previous.   Review of Systems: As presented in the history of presenting illness, rest negative.  History reviewed. No pertinent past medical history. Past Surgical History  Procedure Laterality Date  . Abdominal hysterectomy    . Tonsillectomy    . Cardiac catheterization     Social History:  reports that she has never smoked. She has never used  smokeless tobacco. She reports that she drinks alcohol. She reports that she does not use illicit drugs. Where does patient live home. Can patient participate in ADLs? Yes.  Allergies  Allergen Reactions  . Hydrocodone-Acetaminophen Nausea And Vomiting    Family History:  Family History  Problem Relation Age of Onset  . Scleroderma Mother       Prior to Admission medications   Medication Sig Start Date End Date Taking? Authorizing Provider  aspirin EC 81 MG tablet Take 81 mg by mouth every other day. Pain   Yes Historical Provider, MD  Cyanocobalamin (VITAMIN B-12 PO) Take 1 tablet by mouth daily.   Yes Historical Provider, MD  GARLIC PO Take 1 tablet by mouth daily.   Yes Historical Provider, MD  ibuprofen (ADVIL,MOTRIN) 200 MG tablet Take 800 mg by mouth every 6 (six) hours as needed. Pain   Yes Historical Provider, MD  Magnesium 400 MG CAPS Take 400 mg by mouth daily.   Yes Historical Provider, MD  Multiple Vitamin (MULITIVITAMIN WITH MINERALS) TABS Take 3 tablets by mouth daily. dissolvable   Yes Historical Provider, MD  Omega-3 Fatty Acids (FISH OIL PO) Take 1 tablet by mouth daily.   Yes Historical Provider, MD  OVER THE COUNTER MEDICATION Place 1 drop into both eyes 4 (four) times daily. Eye drop   Yes Historical Provider, MD  Pyridoxine HCl (VITAMIN B-6 PO) Take 1 tablet by mouth daily.   Yes Historical Provider, MD    Physical Exam: Filed Vitals:   07/26/13 1729 07/26/13 1927 07/26/13 1930 07/26/13 2037  BP: 95/48 96/50 105/60 96/57  Pulse: 52 53 46 67  Temp:    97.9 F (36.6 C)  TempSrc:    Oral  Resp: 18  16 18   Height:    5\' 4"  (1.626 m)  Weight:    85.5 kg (188 lb 7.9 oz)  SpO2: 100% 100% 100% 99%     General:  Well-developed well-nourished.  Eyes: Anicteric no pallor.  ENT: No discharge from ears eyes nose mouth.  Neck: No mass felt.  Cardiovascular: S1-S2 heard.  Respiratory: No rhonchi or crepitations.  Abdomen: Soft nontender bowel sounds  present. No guarding rigidity.  Skin: No rash.  Musculoskeletal: No edema.  Psychiatric: Appears normal.  Neurologic: Alert awake oriented to time place and person. Moves all extremities..  Labs on Admission:  Basic Metabolic Panel:  Recent Labs Lab 07/26/13 1215  NA 140  K 4.1  CL 102  CO2 23  GLUCOSE 127*  BUN 14  CREATININE 0.75  CALCIUM 9.3   Liver Function Tests:  Recent Labs Lab 07/26/13 1215  AST 26  ALT 28  ALKPHOS 78  BILITOT 0.4  PROT 6.8  ALBUMIN 4.1    Recent Labs Lab 07/26/13 1215  LIPASE 30   No results found for this basename: AMMONIA,  in the last 168 hours CBC:  Recent Labs Lab 07/26/13 1215  WBC 8.6  HGB 13.1  HCT 39.5  MCV 87.4  PLT 282   Cardiac Enzymes: No results found for this basename: CKTOTAL, CKMB, CKMBINDEX, TROPONINI,  in the last 168 hours  BNP (last 3 results) No results found for this basename: PROBNP,  in the last 8760 hours CBG:  Recent Labs Lab 07/26/13 1222  GLUCAP 71    Radiological Exams on Admission: Dg Chest 2 View  07/26/2013   CLINICAL DATA:  Stroke-like symptoms  EXAM: CHEST  2 VIEW  COMPARISON:  CT chest dated 06/11/2006  FINDINGS: Lungs are clear.  No pleural effusion or pneumothorax.  The heart is normal in size.  Visualized osseous structures are within normal limits.  IMPRESSION: No evidence of acute cardiopulmonary disease.   Electronically Signed   By: Charline BillsSriyesh  Krishnan M.D.   On: 07/26/2013 13:49   Ct Head Wo Contrast  07/26/2013   CLINICAL DATA:  Tingling  EXAM: CT HEAD WITHOUT CONTRAST  TECHNIQUE: Contiguous axial images were obtained from the base of the skull through the vertex without intravenous contrast.  COMPARISON:  MR HEAD WO/W CM dated 06/10/2009  FINDINGS: Brain parenchyma, ventricular system, and extra-axial space are within normal limits. No mass effect, midline shift, or acute intracranial hemorrhage. Mastoid air cells are clear. Visualized paranasal sinuses are clear. Cranium is  intact. No acute bony deformity.  IMPRESSION: Negative at CT.   Electronically Signed   By: Maryclare BeanArt  Hoss M.D.   On: 07/26/2013 13:23   Mr Brain Wo Contrast  07/26/2013   CLINICAL DATA:  Facial numbness  EXAM: MRI HEAD WITHOUT CONTRAST  TECHNIQUE: Multiplanar, multiecho pulse sequences of the brain and surrounding structures were obtained without intravenous contrast.  COMPARISON:  Head CT same day  FINDINGS: The brain has a normal appearance on all pulse sequences without evidence of malformation, atrophy, old or acute infarction, mass lesion, hemorrhage, hydrocephalus or extra-axial collection. No pituitary mass. No fluid in the sinuses, middle ears or mastoids. No skull or skullbase lesion. There is flow in the major vessels at the base of the brain. Major venous sinuses show flow.  IMPRESSION: Normal examination.  No cause of facial numbness identified.  Electronically Signed   By: Paulina FusiMark  Shogry M.D.   On: 07/26/2013 19:18    EKG: Independently reviewed. Sinus bradycardia rate of 51 beats per minute.  Assessment/Plan Principal Problem:   Lethargy Active Problems:   Slurred speech   Bradycardia   UTI (lower urinary tract infection)   1. Lethargy and slurred speech - could be from medication induced. I have discussed with poison control and at this time poison control feels that the diet pill Garcinia Cambogia and the multivitamin pill cannot cause the symptoms. Since patient was initially having a blood sugar of 71 at this time we will closely follow CBGs. Check cortisol levels, ammonia levels, TSH. Urine drug screen is pending. Patient denies having take any salicylates. Continue with hydration. If patient's lethargy is chronic then may need sleep study. 2. Bradycardia - presently shows sinus bradycardia. Closely monitor in telemetry. Check TSH. 3. History of hyperlipidemia intolerant to statins. 4. History of GERD.    Code Status: Full code.  Family Communication: Patient's husband.   Disposition Plan: Admit for observation.    Eduard ClosArshad N Trystin Hargrove [facility] Pager [phone].  If 7PM-7AM, please contact night-coverage [email or web address] Password [email or web address] 07/26/2013, 9:00 PM

## 2013-07-26 NOTE — ED Notes (Signed)
Pt c/o "wormy feeling all over my head and numbness all over my face and arms and feeling tired" since this am. She reports the numbness and tingling have gotten better since onset but she still feels "very tired." she is a&ox4, breathing easily

## 2013-07-26 NOTE — ED Notes (Signed)
MRI called and informed patient now wants to have an MRI. They state they will put her back on the list.

## 2013-07-26 NOTE — ED Notes (Signed)
Patient transported to X-ray 

## 2013-07-26 NOTE — ED Provider Notes (Signed)
CSN: [id number]     Arrival date & time 07/26/13  1139 History   First MD Initiated Contact with Patient 07/26/13 1245     Chief Complaint  Patient presents with  . Stroke Symptoms     (Consider location/radiation/quality/duration/timing/severity/associated sxs/prior Treatment) The history is provided by the patient and the spouse.   48 year old female. Patient feeling fine until last evening when she had a heavy pressure sensation in her head. Went to bed got up this morning felt okay initially and then about 8:30 in the morning started to feel very tired and fatigued. His symptoms have persisted. Patient keeps dozing off. According to family speech seems to be somewhat slurred not thinking very clearly. As stated the symptoms were sort of an acute onset at 8:30 this morning. No history of similar problems.  History reviewed. No pertinent past medical history. Past Surgical History  Procedure Laterality Date  . Abdominal hysterectomy    . Tonsillectomy    . Cardiac catheterization     History reviewed. No pertinent family history. History  Substance Use Topics  . Smoking status: Never Smoker   . Smokeless tobacco: Never Used  . Alcohol Use: Yes     Comment: occasional   OB History   Grav Para Term Preterm Abortions TAB SAB Ect Mult Living                 Review of Systems  Constitutional: Positive for fatigue.  HENT: Negative for congestion.   Eyes: Negative for visual disturbance.  Cardiovascular: Negative for chest pain.  Gastrointestinal: Negative for abdominal pain.  Genitourinary: Negative for dysuria.  Musculoskeletal: Negative for back pain.  Skin: Negative for rash.  Neurological: Positive for speech difficulty, light-headedness and numbness. Negative for dizziness.  Hematological: Does not bruise/bleed easily.  Psychiatric/Behavioral: Positive for confusion.      Allergies  Hydrocodone-acetaminophen  Home Medications   Prior to Admission medications    Medication Sig Start Date End Date Taking? Authorizing Provider  aspirin EC 81 MG tablet Take 81 mg by mouth every other day. Pain   Yes Historical Provider, MD  Cyanocobalamin (VITAMIN B-12 PO) Take 1 tablet by mouth daily.   Yes Historical Provider, MD  GARLIC PO Take 1 tablet by mouth daily.   Yes Historical Provider, MD  ibuprofen (ADVIL,MOTRIN) 200 MG tablet Take 800 mg by mouth every 6 (six) hours as needed. Pain   Yes Historical Provider, MD  Magnesium 400 MG CAPS Take 400 mg by mouth daily.   Yes Historical Provider, MD  Multiple Vitamin (MULITIVITAMIN WITH MINERALS) TABS Take 3 tablets by mouth daily. dissolvable   Yes Historical Provider, MD  Omega-3 Fatty Acids (FISH OIL PO) Take 1 tablet by mouth daily.   Yes Historical Provider, MD  OVER THE COUNTER MEDICATION Place 1 drop into both eyes 4 (four) times daily. Eye drop   Yes Historical Provider, MD  Pyridoxine HCl (VITAMIN B-6 PO) Take 1 tablet by mouth daily.   Yes Historical Provider, MD   BP 96/50  Pulse 53  Temp(Src) 97.3 F (36.3 C) (Oral)  Resp 18  Ht 5\' 6"  (1.676 m)  Wt 189 lb (85.73 kg)  BMI 30.52 kg/m2  SpO2 100% Physical Exam  Nursing note and vitals reviewed. Constitutional: She appears well-developed and well-nourished. No distress.  HENT:  Head: Normocephalic and atraumatic.  Mouth/Throat: Oropharynx is clear and moist.  Eyes: Conjunctivae and EOM are normal. Pupils are equal, round, and reactive to light.  Neck: Normal range  of motion.  Cardiovascular: Regular rhythm and normal heart sounds.   Bradycardia.  Pulmonary/Chest: Effort normal and breath sounds normal. No respiratory distress.  Abdominal: Bowel sounds are normal. There is no tenderness.  Musculoskeletal: Normal range of motion.  Neurological: She is alert. No cranial nerve deficit. She exhibits normal muscle tone. Coordination normal.  Awake but drowsy. Speech seems to be slurred. Numbness on both sides of the face. Her function appears to be  normal.  Skin: Skin is warm. No rash noted.    ED Course  Procedures (including critical care time) Labs Review Labs Reviewed  BASIC METABOLIC PANEL - Abnormal; Notable for the following:    Glucose, Bld 127 (*)    All other components within normal limits  URINALYSIS, ROUTINE W REFLEX MICROSCOPIC - Abnormal; Notable for the following:    APPearance CLOUDY (*)    Leukocytes, UA MODERATE (*)    All other components within normal limits  URINE MICROSCOPIC-ADD ON - Abnormal; Notable for the following:    Bacteria, UA FEW (*)    All other components within normal limits  CBC  HEPATIC FUNCTION PANEL  LIPASE, BLOOD  CBG MONITORING, ED  I-STAT CG4 LACTIC ACID, ED  I-STAT TROPOININ, ED   Results for orders placed during the hospital encounter of 07/26/13  CBC      Result Value Ref Range   WBC 8.6  4.0 - 10.5 K/uL   RBC 4.52  3.87 - 5.11 MIL/uL   Hemoglobin 13.1  12.0 - 15.0 g/dL   HCT 40.9  81.1 - 91.4 %   MCV 87.4  78.0 - 100.0 fL   MCH 29.0  26.0 - 34.0 pg   MCHC 33.2  30.0 - 36.0 g/dL   RDW 78.2  95.6 - 21.3 %   Platelets 282  150 - 400 K/uL  BASIC METABOLIC PANEL      Result Value Ref Range   Sodium 140  137 - 147 mEq/L   Potassium 4.1  3.7 - 5.3 mEq/L   Chloride 102  96 - 112 mEq/L   CO2 23  19 - 32 mEq/L   Glucose, Bld 127 (*) 70 - 99 mg/dL   BUN 14  6 - 23 mg/dL   Creatinine, Ser 0.86  0.50 - 1.10 mg/dL   Calcium 9.3  8.4 - 57.8 mg/dL   GFR calc non Af Amer >90  >90 mL/min   GFR calc Af Amer >90  >90 mL/min  HEPATIC FUNCTION PANEL      Result Value Ref Range   Total Protein 6.8  6.0 - 8.3 g/dL   Albumin 4.1  3.5 - 5.2 g/dL   AST 26  0 - 37 U/L   ALT 28  0 - 35 U/L   Alkaline Phosphatase 78  39 - 117 U/L   Total Bilirubin 0.4  0.3 - 1.2 mg/dL   Bilirubin, Direct <4.6  0.0 - 0.3 mg/dL   Indirect Bilirubin NOT CALCULATED  0.3 - 0.9 mg/dL  LIPASE, BLOOD      Result Value Ref Range   Lipase 30  11 - 59 U/L  URINALYSIS, ROUTINE W REFLEX MICROSCOPIC      Result  Value Ref Range   Color, Urine YELLOW  YELLOW   APPearance CLOUDY (*) CLEAR   Specific Gravity, Urine 1.025  1.005 - 1.030   pH 7.5  5.0 - 8.0   Glucose, UA NEGATIVE  NEGATIVE mg/dL   Hgb urine dipstick NEGATIVE  NEGATIVE  Bilirubin Urine NEGATIVE  NEGATIVE   Ketones, ur NEGATIVE  NEGATIVE mg/dL   Protein, ur NEGATIVE  NEGATIVE mg/dL   Urobilinogen, UA 0.2  0.0 - 1.0 mg/dL   Nitrite NEGATIVE  NEGATIVE   Leukocytes, UA MODERATE (*) NEGATIVE  URINE MICROSCOPIC-ADD ON      Result Value Ref Range   Squamous Epithelial / LPF RARE  RARE   WBC, UA 11-20  <3 WBC/hpf   RBC / HPF 0-2  <3 RBC/hpf   Bacteria, UA FEW (*) RARE  CBG MONITORING, ED      Result Value Ref Range   Glucose-Capillary 71  70 - 99 mg/dL  I-STAT CG4 LACTIC ACID, ED      Result Value Ref Range   Lactic Acid, Venous 0.84  0.5 - 2.2 mmol/L  I-STAT TROPOININ, ED      Result Value Ref Range   Troponin i, poc 0.00  0.00 - 0.08 ng/mL   Comment 3              Imaging Review Dg Chest 2 View  07/26/2013   CLINICAL DATA:  Stroke-like symptoms  EXAM: CHEST  2 VIEW  COMPARISON:  CT chest dated 06/11/2006  FINDINGS: Lungs are clear.  No pleural effusion or pneumothorax.  The heart is normal in size.  Visualized osseous structures are within normal limits.  IMPRESSION: No evidence of acute cardiopulmonary disease.   Electronically Signed   By: Charline BillsSriyesh  Krishnan M.D.   On: 07/26/2013 13:49   Ct Head Wo Contrast  07/26/2013   CLINICAL DATA:  Tingling  EXAM: CT HEAD WITHOUT CONTRAST  TECHNIQUE: Contiguous axial images were obtained from the base of the skull through the vertex without intravenous contrast.  COMPARISON:  MR HEAD WO/W CM dated 06/10/2009  FINDINGS: Brain parenchyma, ventricular system, and extra-axial space are within normal limits. No mass effect, midline shift, or acute intracranial hemorrhage. Mastoid air cells are clear. Visualized paranasal sinuses are clear. Cranium is intact. No acute bony deformity.  IMPRESSION:  Negative at CT.   Electronically Signed   By: Maryclare BeanArt  Hoss M.D.   On: 07/26/2013 13:23   Mr Brain Wo Contrast  07/26/2013   CLINICAL DATA:  Facial numbness  EXAM: MRI HEAD WITHOUT CONTRAST  TECHNIQUE: Multiplanar, multiecho pulse sequences of the brain and surrounding structures were obtained without intravenous contrast.  COMPARISON:  Head CT same day  FINDINGS: The brain has a normal appearance on all pulse sequences without evidence of malformation, atrophy, old or acute infarction, mass lesion, hemorrhage, hydrocephalus or extra-axial collection. No pituitary mass. No fluid in the sinuses, middle ears or mastoids. No skull or skullbase lesion. There is flow in the major vessels at the base of the brain. Major venous sinuses show flow.  IMPRESSION: Normal examination.  No cause of facial numbness identified.   Electronically Signed   By: Paulina FusiMark  Shogry M.D.   On: 07/26/2013 19:18     EKG Interpretation None      Date: 07/26/2013  Rate: 46  Rhythm: sinus bradycardia  QRS Axis: normal  Intervals: normal  ST/T Wave abnormalities: normal  Conduction Disutrbances:none  Narrative Interpretation:   Old EKG Reviewed: unchanged Unchanged from previous EKG today. Both showed sinus bradycardia. No evidence of an AV block.      MDM   Final diagnoses:  Altered mental status  Bradycardia  UTI (lower urinary tract infection)    Extensive workup without cause for patient's neurological symptoms and altered mental status. Patient seems  to have slurring of speech. MRI negative head CT negative. The objectively there definitely is a urinary tract infection. This patient had recently started an over-the-counter medication now for dieting about 4 days ago that's a possibility as the cause I guess. Patient needs to be admitted and observed due to the altered mental status city etiology is not clear. Extensive workup negative.  Patient does have a fairly significant sinus bradycardia she believes that is  the cause of her fatigue. Checking thyroid function test would be reasonable. This was an acute onset process. No evidence of any intercranial pathology no anemia like a gas is not elevated no evidence of serious infection. Troponin was negative. Her function test are normal. No leukocytosis. Will contact for admission.    Shelda JakesScott W. Arzu Mcgaughey, MD 07/26/13 [phone]

## 2013-07-27 DIAGNOSIS — F411 Generalized anxiety disorder: Secondary | ICD-10-CM

## 2013-07-27 DIAGNOSIS — R4182 Altered mental status, unspecified: Secondary | ICD-10-CM

## 2013-07-27 LAB — CBC WITH DIFFERENTIAL/PLATELET
BASOS PCT: 1 % (ref 0–1)
Basophils Absolute: 0 10*3/uL (ref 0.0–0.1)
EOS ABS: 0.1 10*3/uL (ref 0.0–0.7)
Eosinophils Relative: 2 % (ref 0–5)
HCT: 38.1 % (ref 36.0–46.0)
HEMOGLOBIN: 12.4 g/dL (ref 12.0–15.0)
Lymphocytes Relative: 49 % — ABNORMAL HIGH (ref 12–46)
Lymphs Abs: 3.3 10*3/uL (ref 0.7–4.0)
MCH: 28.6 pg (ref 26.0–34.0)
MCHC: 32.5 g/dL (ref 30.0–36.0)
MCV: 88 fL (ref 78.0–100.0)
MONOS PCT: 9 % (ref 3–12)
Monocytes Absolute: 0.6 10*3/uL (ref 0.1–1.0)
NEUTROS PCT: 39 % — AB (ref 43–77)
Neutro Abs: 2.6 10*3/uL (ref 1.7–7.7)
Platelets: 241 10*3/uL (ref 150–400)
RBC: 4.33 MIL/uL (ref 3.87–5.11)
RDW: 13.5 % (ref 11.5–15.5)
WBC: 6.6 10*3/uL (ref 4.0–10.5)

## 2013-07-27 LAB — COMPREHENSIVE METABOLIC PANEL
ALT: 23 U/L (ref 0–35)
AST: 22 U/L (ref 0–37)
Albumin: 3.6 g/dL (ref 3.5–5.2)
Alkaline Phosphatase: 70 U/L (ref 39–117)
BILIRUBIN TOTAL: 0.3 mg/dL (ref 0.3–1.2)
BUN: 12 mg/dL (ref 6–23)
CALCIUM: 8.8 mg/dL (ref 8.4–10.5)
CHLORIDE: 108 meq/L (ref 96–112)
CO2: 21 meq/L (ref 19–32)
Creatinine, Ser: 0.78 mg/dL (ref 0.50–1.10)
GFR calc Af Amer: >90 mL/min (ref >90)
Glucose, Bld: 90 mg/dL (ref 70–99)
Potassium: 4.3 mEq/L (ref 3.7–5.3)
SODIUM: 142 meq/L (ref 137–147)
Total Protein: 6.1 g/dL (ref 6.0–8.3)

## 2013-07-27 LAB — GLUCOSE, CAPILLARY
GLUCOSE-CAPILLARY: 93 mg/dL (ref 70–99)
GLUCOSE-CAPILLARY: 94 mg/dL (ref 70–99)
Glucose-Capillary: 86 mg/dL (ref 70–99)

## 2013-07-27 LAB — CORTISOL: Cortisol, Plasma: 12.4 ug/dL

## 2013-07-27 MED ORDER — CIPROFLOXACIN HCL 500 MG PO TABS: 500.0000 mg | ORAL_TABLET | Freq: Two times a day (BID) | ORAL | Status: DC

## 2013-07-27 NOTE — Progress Notes (Signed)
UR completed. Patient changed to inpatient- requiring IVF @ 100cc/hr and IV antibiotics.  

## 2013-07-27 NOTE — Discharge Summary (Signed)
Physician Discharge Summary  YUI MULVANEY IHK:[id number] DOB: 12/24/1965 DOA: 07/26/2013  PCP: Colette Ribas, MD  Admit date: 07/26/2013 Discharge date: 07/27/2013  Time spent: 35 minutes  Recommendations for Outpatient Follow-up:  1. Follow up with PCP in 1-2 weeks  Discharge Diagnoses:  Principal Problem:   Lethargy Active Problems:   Slurred speech   Bradycardia   UTI (lower urinary tract infection)   Discharge Condition: Improved  Diet recommendation: Regular  Filed Weights   07/26/13 1152 07/26/13 2037  Weight: 85.73 kg (189 lb) 85.5 kg (188 lb 7.9 oz)    History of present illness:  Leah Henson is a 48 y.o. female with history of hyperlipidemia and GERD was brought to the ER patient was found to be in increasingly lethargic and having slurred speech. Patient last night had sudden gush of blood flowing sensation in her head following which she also has some chest pressure which resolved by itself after a few minutes. Today morning patient was getting prepared to take her friends to the ophthalmologist when she started feeling some numbness of the face and tingling in the upper extremities. She reached ophthalmologist office when she was looking tired and ophthalmologist office offered her something to eat. Later patient family found her lethargic and easily falling asleep and patient also had slurred speech. Patient did not have any difficulty moving her lower extremities. Patient was brought to the ER and was found to be mildly bradycardic with blood pressure in the 90s systolic. CT head MRI brain did not show anything acute. EKG showed sinus bradycardia. Patient has been recently taking a diet pill called Garcinia Cambogia and also had recently change her multivitamin pills 2 days ago. Patient at this time denies any chest pain or shortness of breath. On my exam patient has become more alert awake and states she feels much better. Patient's husband who was at the  bedside states she is much better from previous.   Hospital Course:  1. Lethargy and slurred speech - Cortisol levels, ammonia levels, and TSH normal. Urine drug screen is unremarkable. Continue with hydration for now. Pt noted to have HR into the 40-30's while symptomatic. Questioned cause of presenting symptoms? Cardiology was consulted, stating bradycardia is likely not the etiology for presenting lethargy. Pt has since remained stable and at baseline. She is medically stable for close outpatient follow up with PCP. 2. Bradycardia - Sinus bradycardia overnight with HR as low as upper 30's. Normal heart rate by the following day. 3. History of hyperlipidemia intolerant to statins. 4. History of GERD. 5. UTI - pt was started on empiric rocephin in the hospital. She will complete her 3 day course of antibiotics with ciprofloxacin on discharge. Consultations:  Cardiology  Discharge Exam: Filed Vitals:   07/27/13 1100 07/27/13 1105 07/27/13 1115 07/27/13 1358  BP: 94/50 100/59 90/55 106/56  Pulse: 75 73 79 78  Temp:    97.5 F (36.4 C)  TempSrc:    Oral  Resp:    18  Height:      Weight:      SpO2:  99% 99% 97%    General: Awake, in nad Cardiovascular: regular, s1, s2 Respiratory: normal resp effort, no wheezing  Discharge Instructions     Medication List         aspirin EC 81 MG tablet  Take 81 mg by mouth every other day. Pain     ciprofloxacin 500 MG tablet  Commonly known as:  CIPRO  Take  1 tablet (500 mg total) by mouth 2 (two) times daily.     FISH OIL PO  Take 1 tablet by mouth daily.     GARLIC PO  Take 1 tablet by mouth daily.     ibuprofen 200 MG tablet  Commonly known as:  ADVIL,MOTRIN  Take 800 mg by mouth every 6 (six) hours as needed. Pain     Magnesium 400 MG Caps  Take 400 mg by mouth daily.     multivitamin with minerals Tabs tablet  Take 3 tablets by mouth daily. dissolvable     OVER THE COUNTER MEDICATION  Place 1 drop into both eyes 4  (four) times daily. Eye drop     VITAMIN B-12 PO  Take 1 tablet by mouth daily.     VITAMIN B-6 PO  Take 1 tablet by mouth daily.       Allergies  Allergen Reactions  . Hydrocodone-Acetaminophen Nausea And Vomiting   Follow-up Information   Follow up with Colette RibasGOLDING, JOHN CABOT, MD. Schedule an appointment as soon as possible for a visit in 1 week.   Specialty:  Family Medicine   Contact information:   [id number] [place] [address] [phone]        The results of significant diagnostics from this hospitalization (including imaging, microbiology, ancillary and laboratory) are listed below for reference.    Significant Diagnostic Studies: Dg Chest 2 View  07/26/2013   CLINICAL DATA:  Stroke-like symptoms  EXAM: CHEST  2 VIEW  COMPARISON:  CT chest dated 06/11/2006  FINDINGS: Lungs are clear.  No pleural effusion or pneumothorax.  The heart is normal in size.  Visualized osseous structures are within normal limits.  IMPRESSION: No evidence of acute cardiopulmonary disease.   Electronically Signed   By: Charline BillsSriyesh  Krishnan M.D.   On: 07/26/2013 13:49   Ct Head Wo Contrast  07/26/2013   CLINICAL DATA:  Tingling  EXAM: CT HEAD WITHOUT CONTRAST  TECHNIQUE: Contiguous axial images were obtained from the base of the skull through the vertex without intravenous contrast.  COMPARISON:  MR HEAD WO/W CM dated 06/10/2009  FINDINGS: Brain parenchyma, ventricular system, and extra-axial space are within normal limits. No mass effect, midline shift, or acute intracranial hemorrhage. Mastoid air cells are clear. Visualized paranasal sinuses are clear. Cranium is intact. No acute bony deformity.  IMPRESSION: Negative at CT.   Electronically Signed   By: Maryclare BeanArt  Hoss M.D.   On: 07/26/2013 13:23   Mr Brain Wo Contrast  07/26/2013   CLINICAL DATA:  Facial numbness  EXAM: MRI HEAD WITHOUT CONTRAST  TECHNIQUE: Multiplanar, multiecho pulse sequences of the brain and surrounding  structures were obtained without intravenous contrast.  COMPARISON:  Head CT same day  FINDINGS: The brain has a normal appearance on all pulse sequences without evidence of malformation, atrophy, old or acute infarction, mass lesion, hemorrhage, hydrocephalus or extra-axial collection. No pituitary mass. No fluid in the sinuses, middle ears or mastoids. No skull or skullbase lesion. There is flow in the major vessels at the base of the brain. Major venous sinuses show flow.  IMPRESSION: Normal examination.  No cause of facial numbness identified.   Electronically Signed   By: Paulina FusiMark  Shogry M.D.   On: 07/26/2013 19:18    Microbiology: No results found for this or any previous visit (from the past 240 hour(s)).   Labs: Basic Metabolic Panel:  Recent Labs Lab 07/26/13 1215 07/26/13 2208 07/27/13 0443  NA 140  --  142  K 4.1  --  4.3  CL 102  --  108  CO2 23  --  21  GLUCOSE 127*  --  90  BUN 14  --  12  CREATININE 0.75 0.71 0.78  CALCIUM 9.3  --  8.8   Liver Function Tests:  Recent Labs Lab 07/26/13 1215 07/27/13 0443  AST 26 22  ALT 28 23  ALKPHOS 78 70  BILITOT 0.4 0.3  PROT 6.8 6.1  ALBUMIN 4.1 3.6    Recent Labs Lab 07/26/13 1215  LIPASE 30    Recent Labs Lab 07/26/13 2208  AMMONIA 16   CBC:  Recent Labs Lab 07/26/13 1215 07/26/13 2208 07/27/13 0443  WBC 8.6 7.2 6.6  NEUTROABS  --   --  2.6  HGB 13.1 12.4 12.4  HCT 39.5 37.5 38.1  MCV 87.4 87.4 88.0  PLT 282 246 241   Cardiac Enzymes:  Recent Labs Lab 07/26/13 2208  TROPONINI <0.30   BNP: BNP (last 3 results) No results found for this basename: PROBNP,  in the last 8760 hours CBG:  Recent Labs Lab 07/26/13 1222 07/27/13 0046 07/27/13 0458 07/27/13 0753  GLUCAP 71 94 86 93    Signed:  Jerald KiefStephen K Allice Garro  [facility] 07/27/2013, 5:16 PM

## 2013-07-27 NOTE — Progress Notes (Signed)
Orthostatics vital completed. See Epic.

## 2013-07-27 NOTE — Consult Note (Signed)
CARDIOLOGY CONSULT NOTE   Patient ID: Leah Henson MRN: [id number] DOB/AGE: 48/06/1965 48 y.o.  Admit date: 07/26/2013  Primary Physician   Colette RibasGOLDING, JOHN CABOT, MD Primary Cardiologist   Dr. Tresa EndoKelly  Reason for Consultation   Chest pain   HPI: Leah Henson is a 48 y.o. female with a history of hyperlipidemia, GERD, mild obesity, and chronic atypical chest pain who was brought to the ER with lethargy, slurred speech and bradycardia. She had previously undergone cardiac catheterization in 2008 which showed normal coronary arteries. Over the past 2 years, she continues to experience some stress mediated chest discomfort. She denies exertional chest pain. She does have some SOB due to recent weight gain. Two nights ago she reports a sharp head pain some chest pressure which resolved after a few minutes. This morning she started noticing some numbness of the face and tingling in the upper extremities. Later, the patient's family found her drowsy with slurred speech. She did not have any difficulty moving her lower extremities. Patient was brought to the ED and was found to be mildly bradycardic with blood pressure in the 90s systolic. CT head MRI brain did not show any acute abnormalities. EKG showed sinus bradycardia. Patient has been recently taking a diet pill called Garcinia Cambogia and also had recently change her multivitamin pills 2 days ago. Patient at this time denies any chest pain or shortness of breath. On my exam patient has become more alert awake and states she feels much better. Patient's husband who was at the bedside states she is much better from previous. She has had a previous sleep study which was normal.     History reviewed. No pertinent past medical history.   Past Surgical History  Procedure Laterality Date  . Abdominal hysterectomy    . Tonsillectomy    . Cardiac catheterization      Allergies  Allergen Reactions  . Hydrocodone-Acetaminophen Nausea  And Vomiting    I have reviewed the patient's current medications . cefTRIAXone (ROCEPHIN)  IV  1 g Intravenous Q24H  . enoxaparin (LOVENOX) injection  40 mg Subcutaneous Q24H   . sodium chloride 100 mL/hr at 07/27/13 0638   ondansetron (ZOFRAN) IV, ondansetron  Prior to Admission medications   Medication Sig Start Date End Date Taking? Authorizing Provider  aspirin EC 81 MG tablet Take 81 mg by mouth every other day. Pain   Yes Historical Provider, MD  Cyanocobalamin (VITAMIN B-12 PO) Take 1 tablet by mouth daily.   Yes Historical Provider, MD  GARLIC PO Take 1 tablet by mouth daily.   Yes Historical Provider, MD  ibuprofen (ADVIL,MOTRIN) 200 MG tablet Take 800 mg by mouth every 6 (six) hours as needed. Pain   Yes Historical Provider, MD  Magnesium 400 MG CAPS Take 400 mg by mouth daily.   Yes Historical Provider, MD  Multiple Vitamin (MULITIVITAMIN WITH MINERALS) TABS Take 3 tablets by mouth daily. dissolvable   Yes Historical Provider, MD  Omega-3 Fatty Acids (FISH OIL PO) Take 1 tablet by mouth daily.   Yes Historical Provider, MD  OVER THE COUNTER MEDICATION Place 1 drop into both eyes 4 (four) times daily. Eye drop   Yes Historical Provider, MD  Pyridoxine HCl (VITAMIN B-6 PO) Take 1 tablet by mouth daily.   Yes Historical Provider, MD     History   Social History  . Marital Status: Married    Spouse Name: N/A    Number of Children: N/A  .  Years of Education: N/A   Occupational History  . Not on file.   Social History Main Topics  . Smoking status: Never Smoker   . Smokeless tobacco: Never Used  . Alcohol Use: Yes     Comment: occasional  . Drug Use: No  . Sexual Activity: Not on file   Other Topics Concern  . Not on file   Social History Narrative  . No narrative on file    Family Status  Relation Status Death Age  . Mother Deceased   . Father Deceased   . Sister Alive   . Brother Alive   . Sister Alive    Family History  Problem Relation Age of Onset    . Scleroderma Mother      ROS:  Full 14 point review of systems complete and found to be negative unless listed above.  Physical Exam: Blood pressure 106/56, pulse 78, temperature 97.5 F (36.4 C), temperature source Oral, resp. rate 18, height 5\' 4"  (1.626 m), weight 188 lb 7.9 oz (85.5 kg), SpO2 97.00%.  General: Well developed, well nourished, female in no acute distress. Mildly obese Head: Eyes PERRLA, No xanthomas.   Normocephalic and atraumatic, oropharynx without edema or exudate. Dentition:  Lungs: CTAB Heart: HRRR S1 S2, no rub/gallop, Heart irregular rate and rhythm with S1, S2  murmur. pulses are 2+ extrem.   Neck: No carotid bruits. No lymphadenopathy. no JVD. Abdomen: Bowel sounds present, abdomen soft and non-tender without masses or hernias noted. Msk:  No spine or cva tenderness. No weakness, no joint deformities or effusions. Extremities: No clubbing or cyanosis.  edema.  Neuro: Alert and oriented X 3. No focal deficits noted. Psych:  Good affect, responds appropriately Skin: No rashes or lesions noted.  Labs:   Lab Results  Component Value Date   WBC 6.6 07/27/2013   HGB 12.4 07/27/2013   HCT 38.1 07/27/2013   MCV 88.0 07/27/2013   PLT 241 07/27/2013     Recent Labs Lab 07/27/13 0443  NA 142  K 4.3  CL 108  CO2 21  BUN 12  CREATININE 0.78  CALCIUM 8.8  PROT 6.1  BILITOT 0.3  ALKPHOS 70  ALT 23  AST 22  GLUCOSE 90  ALBUMIN 3.6    Recent Labs  07/26/13 2208  TROPONINI <0.30    Recent Labs  07/26/13 1831  TROPIPOC 0.00    Lab Results  Component Value Date   LDLCALC 109* 02/15/2013   TRIG 72 02/15/2013    Lipase  Date/Time Value Ref Range Status  07/26/2013 12:15 PM 30  11 - 59 U/L Final   TSH  Date/Time Value Ref Range Status  07/26/2013  8:00 PM 3.350  0.350 - 4.500 uIU/mL Final     Please note change in reference range.     Echo: none  ECG:  Sinus brady HR 46  TELE: NSR HR 70s  Radiology:  Dg Chest 2 View  07/26/2013    CLINICAL DATA:  Stroke-like symptoms  EXAM: CHEST  2 VIEW  COMPARISON:  CT chest dated 06/11/2006  FINDINGS: Lungs are clear.  No pleural effusion or pneumothorax.  The heart is normal in size.  Visualized osseous structures are within normal limits.  IMPRESSION: No evidence of acute cardiopulmonary disease.   Electronically Signed   By: Charline BillsSriyesh  Krishnan M.D.   On: 07/26/2013 13:49   Ct Head Wo Contrast  07/26/2013   CLINICAL DATA:  Tingling  EXAM: CT HEAD WITHOUT CONTRAST  TECHNIQUE:  Contiguous axial images were obtained from the base of the skull through the vertex without intravenous contrast.  COMPARISON:  MR HEAD WO/W CM dated 06/10/2009  FINDINGS: Brain parenchyma, ventricular system, and extra-axial space are within normal limits. No mass effect, midline shift, or acute intracranial hemorrhage. Mastoid air cells are clear. Visualized paranasal sinuses are clear. Cranium is intact. No acute bony deformity.  IMPRESSION: Negative at CT.   Electronically Signed   By: Maryclare Bean M.D.   On: 07/26/2013 13:23   Mr Brain Wo Contrast  07/26/2013   CLINICAL DATA:  Facial numbness  EXAM: MRI HEAD WITHOUT CONTRAST  TECHNIQUE: Multiplanar, multiecho pulse sequences of the brain and surrounding structures were obtained without intravenous contrast.  COMPARISON:  Head CT same day  FINDINGS: The brain has a normal appearance on all pulse sequences without evidence of malformation, atrophy, old or acute infarction, mass lesion, hemorrhage, hydrocephalus or extra-axial collection. No pituitary mass. No fluid in the sinuses, middle ears or mastoids. No skull or skullbase lesion. There is flow in the major vessels at the base of the brain. Major venous sinuses show flow.  IMPRESSION: Normal examination.  No cause of facial numbness identified.   Electronically Signed   By: Paulina Fusi M.D.   On: 07/26/2013 19:18    ASSESSMENT AND PLAN:    Principal Problem:   Lethargy Active Problems:   Slurred speech   Bradycardia    UTI (lower urinary tract infection)  Leah Henson is a 48 y.o. female with a history of hyperlipidemia, GERD, mild obesity, and chronic atypical chest pain who was brought to the ER with lethargy, slurred speech and bradycardia. She had previously undergone cardiac catheterization in 2008 which showed normal coronary arteries. She does note stress induced chest discomfort without exertional precipitation.  Bradycardia - presently shows sinus bradycardia. Closely monitor in telemetry w/ HR as low as the upper 30's overnight. Pt noted to have HR into the 40-30's while symptomatic. Currently HR in 70s-80s.   Lethargy and slurred speech - Cortisol levels, ammonia levels, and TSH normal. Urine drug screen is unremarkable. Continue with hydration for now. MRI and CT normal.  Hyperlipidemia intolerant to statins.   GERD- PPI   Signed: Thereasa Henson, Leah Henson 07/27/2013 3:48 PM  Pager [phone]  Co-Sign MD  Patient seen and examined with Hilaria Ota, PA-C. We discussed all aspects of the encounter. I agree with the assessment and plan as stated above. Doubt lethargy is cardiac related. Telemetry reviewed no further bradycardia. Suspect she has sleep disorder but previous sleep study negative for OSA. She can be discharged today from cardiac perspective with f/u with Dr. Tresa Endo.   Bevelyn Buckles Betha Shadix,MD 5:09 PM

## 2013-08-10 ENCOUNTER — Encounter: Payer: Self-pay | Admitting: Diagnostic Neuroimaging

## 2013-08-10 ENCOUNTER — Ambulatory Visit (INDEPENDENT_AMBULATORY_CARE_PROVIDER_SITE_OTHER): Admitting: Diagnostic Neuroimaging

## 2013-08-10 VITALS — BP 132/81 | HR 64 | Temp 97.8°F | Ht 64.0 in | Wt 189.5 lb

## 2013-08-10 DIAGNOSIS — R4781 Slurred speech: Secondary | ICD-10-CM

## 2013-08-10 DIAGNOSIS — R4789 Other speech disturbances: Secondary | ICD-10-CM

## 2013-08-10 DIAGNOSIS — R4182 Altered mental status, unspecified: Secondary | ICD-10-CM

## 2013-08-10 NOTE — Progress Notes (Signed)
[facility]  PATIENT: Leah Henson DOB: 12/10/1965  REFERRING CLINICIAN: Jean Rosenthaljackson HISTORY FROM: patient  REASON FOR VISIT: new consult   HISTORICAL  CHIEF COMPLAINT:  Chief Complaint  Patient presents with  . New Evaluation    Jackson,stroke like symptoms-paper referral    HISTORY OF PRESENT ILLNESS:   48 year old right-handed female here for evaluation of abnormal spell with altered consciousness, lethargy, slurred speech.  Patient has been under increasing stress over past few months. She also was having increasing weight gain and therefore tapered off Zoloft approximately March 2015. April 12-15 patient took a new type of weight loss pill (garcenia). She also took some new vitamins tablet on April 14th at April 16. Patient went to the emergency room for evaluation. She is noted to have lethargy, confusion, slurred speech. Also diagnosed with dehydration and UTI. Patient was monitored overnight with improvement in symptoms. Patient was discharged on. Since that time patient will no further symptoms.  REVIEW OF SYSTEMS: Full 14 system review of systems performed and notable only for restless legs feeling hot feeling cold anxiety allergies weight gain.   ALLERGIES: Allergies  Allergen Reactions  . Hydrocodone-Acetaminophen Nausea And Vomiting    HOME MEDICATIONS: Outpatient Prescriptions Prior to Visit  Medication Sig Dispense Refill  . aspirin EC 81 MG tablet Take 81 mg by mouth every other day. Pain      . GARLIC PO Take 1 tablet by mouth daily.      Marland Kitchen. ibuprofen (ADVIL,MOTRIN) 200 MG tablet Take 200 mg by mouth every 6 (six) hours as needed. Pain,as needed      . Omega-3 Fatty Acids (FISH OIL PO) Take 1 tablet by mouth daily.      Marland Kitchen. OVER THE COUNTER MEDICATION Place 1 drop into both eyes 4 (four) times daily. Eye drop      . ciprofloxacin (CIPRO) 500 MG tablet Take 1 tablet (500 mg total) by mouth 2 (two) times daily.  4 tablet  0  .  Cyanocobalamin (VITAMIN B-12 PO) Take 1 tablet by mouth daily.      . Magnesium 400 MG CAPS Take 400 mg by mouth daily.      . Multiple Vitamin (MULITIVITAMIN WITH MINERALS) TABS Take 3 tablets by mouth daily. dissolvable      . Pyridoxine HCl (VITAMIN B-6 PO) Take 1 tablet by mouth daily.       No facility-administered medications prior to visit.    PAST MEDICAL HISTORY: Past Medical History  Diagnosis Date  . Anxiety     PAST SURGICAL HISTORY: Past Surgical History  Procedure Laterality Date  . Abdominal hysterectomy    . Tonsillectomy    . Cardiac catheterization    . Coma  1980's    for seven days    FAMILY HISTORY: Family History  Problem Relation Age of Onset  . Scleroderma Mother     SOCIAL HISTORY:  History   Social History  . Marital Status: Married    Spouse Name: Merlyn AlbertFred    Number of Children: 2  . Years of Education: 13   Occupational History  .      [employer]   Social History Main Topics  . Smoking status: Never Smoker   . Smokeless tobacco: Never Used  . Alcohol Use: Yes     Comment: occasional  . Drug Use: No  . Sexual Activity: Not on file   Other Topics Concern  . Not on file   Social History Narrative  Patient is right handed, resides in home with husband     PHYSICAL EXAM  Filed Vitals:   08/10/13 1016  BP: 132/81  Pulse: 64  Temp: 97.8 F (36.6 C)  TempSrc: Oral  Height: 5\' 4"  (1.626 m)  Weight: 189 lb 8 oz (85.957 kg)    Not recorded    Body mass index is 32.51 kg/(m^2).  GENERAL EXAM: Patient is in no distress; well developed, nourished and groomed; neck is supple  CARDIOVASCULAR: Regular rate and rhythm, no murmurs, no carotid bruits  NEUROLOGIC: MENTAL STATUS: awake, alert, oriented to person, place and time, recent and remote memory intact, normal attention and concentration, language fluent, comprehension intact, naming intact, fund of knowledge appropriate CRANIAL NERVE: no papilledema on  fundoscopic exam, pupils equal and reactive to light, visual fields full to confrontation, extraocular muscles intact, no nystagmus, facial sensation and strength symmetric, hearing intact, palate elevates symmetrically, uvula midline, shoulder shrug symmetric, tongue midline. MOTOR: normal bulk and tone, full strength in the BUE, BLE SENSORY: normal and symmetric to light touch, temperature, vibration  COORDINATION: finger-nose-finger, fine finger movements normal REFLEXES: deep tendon reflexes present and symmetric GAIT/STATION: narrow based gait; able to walk on toes, heels and tandem; romberg is negative    DIAGNOSTIC DATA (LABS, IMAGING, TESTING) - I reviewed patient records, labs, notes, testing and imaging myself where available.  Lab Results  Component Value Date   WBC 6.6 07/27/2013   HGB 12.4 07/27/2013   HCT 38.1 07/27/2013   MCV 88.0 07/27/2013   PLT 241 07/27/2013      Component Value Date/Time   NA 142 07/27/2013 0443   K 4.3 07/27/2013 0443   CL 108 07/27/2013 0443   CO2 21 07/27/2013 0443   GLUCOSE 90 07/27/2013 0443   BUN 12 07/27/2013 0443   CREATININE 0.78 07/27/2013 0443   CREATININE 0.84 02/15/2013 0910   CALCIUM 8.8 07/27/2013 0443   PROT 6.1 07/27/2013 0443   ALBUMIN 3.6 07/27/2013 0443   AST 22 07/27/2013 0443   ALT 23 07/27/2013 0443   ALKPHOS 70 07/27/2013 0443   BILITOT 0.3 07/27/2013 0443   GFRNONAA >90 07/27/2013 0443   GFRAA >90 07/27/2013 0443   Lab Results  Component Value Date   LDLCALC 109* 02/15/2013   TRIG 72 02/15/2013   No results found for this basename: HGBA1C   No results found for this basename: VITAMINB12   Lab Results  Component Value Date   TSH 3.350 07/26/2013    I reviewed images myself and agree with interpretation. -VRP  07/26/13 MRI brain - normal  07/27/13 EKG - sinus bradycardia   ASSESSMENT AND PLAN  48 y.o. year old female here with single episode of confusion, lethargy, following taking new diet supplements and vitamins, with  dehydration and UTI. Now doing better. I do not think this represented a TIA.  Ddx: toxic/metabolic encephalopathy, UTI, anxiety/stress reaction   PLAN: - avoid diet pills / supplements - continue 20min exercise per day  Return if symptoms worsen or fail to improve, for return to PCP.    Suanne MarkerVIKRAM R. PENUMALLI, MD 08/10/2013, 11:35 AM Certified in Neurology, Neurophysiology and Neuroimaging  [facility] [address], [address] [place], [place] [address] [phone]

## 2014-02-01 ENCOUNTER — Encounter: Payer: Self-pay | Admitting: *Deleted

## 2014-02-04 ENCOUNTER — Telehealth: Payer: Self-pay | Admitting: Cardiovascular Disease

## 2014-02-04 NOTE — Telephone Encounter (Signed)
NO LABS , WAIT UNTIL YOUR APPOINTMENT WITH DR Tresa EndoKELLY

## 2014-02-04 NOTE — Telephone Encounter (Signed)
Pt wants to know if she need lab work before her appt on Wednesday or does she need to come fasting?

## 2014-02-06 ENCOUNTER — Encounter: Payer: Self-pay | Admitting: Cardiovascular Disease

## 2014-02-06 ENCOUNTER — Ambulatory Visit (INDEPENDENT_AMBULATORY_CARE_PROVIDER_SITE_OTHER): Admitting: Cardiovascular Disease

## 2014-02-06 VITALS — BP 114/70 | HR 62 | Ht 64.5 in | Wt 185.3 lb

## 2014-02-06 DIAGNOSIS — Z79899 Other long term (current) drug therapy: Secondary | ICD-10-CM

## 2014-02-06 DIAGNOSIS — F419 Anxiety disorder, unspecified: Secondary | ICD-10-CM

## 2014-02-06 DIAGNOSIS — E785 Hyperlipidemia, unspecified: Secondary | ICD-10-CM

## 2014-02-06 DIAGNOSIS — E669 Obesity, unspecified: Secondary | ICD-10-CM

## 2014-02-06 DIAGNOSIS — R0789 Other chest pain: Secondary | ICD-10-CM

## 2014-02-06 DIAGNOSIS — G458 Other transient cerebral ischemic attacks and related syndromes: Secondary | ICD-10-CM

## 2014-02-06 LAB — COMPREHENSIVE METABOLIC PANEL
ALK PHOS: 76 U/L (ref 39–117)
ALT: 25 U/L (ref 0–35)
AST: 27 U/L (ref 0–37)
Albumin: 4.5 g/dL (ref 3.5–5.2)
BUN: 14 mg/dL (ref 6–23)
CHLORIDE: 107 meq/L (ref 96–112)
CO2: 24 mEq/L (ref 19–32)
Calcium: 9.2 mg/dL (ref 8.4–10.5)
Creat: 0.84 mg/dL (ref 0.50–1.10)
Glucose, Bld: 83 mg/dL (ref 70–99)
Potassium: 4.5 mEq/L (ref 3.5–5.3)
SODIUM: 141 meq/L (ref 135–145)
TOTAL PROTEIN: 6.9 g/dL (ref 6.0–8.3)
Total Bilirubin: 0.6 mg/dL (ref 0.2–1.2)

## 2014-02-06 LAB — TSH: TSH: 2.128 u[IU]/mL (ref 0.350–4.500)

## 2014-02-06 LAB — LIPID PANEL
Cholesterol: 172 mg/dL (ref 0–200)
HDL: 44 mg/dL (ref >39)
LDL CALC: 113 mg/dL — AB (ref 0–99)
Total CHOL/HDL Ratio: 3.9 Ratio
Triglycerides: 76 mg/dL (ref <150)
VLDL: 15 mg/dL (ref 0–40)

## 2014-02-06 LAB — CBC
HEMATOCRIT: 41.2 % (ref 36.0–46.0)
Hemoglobin: 13.7 g/dL (ref 12.0–15.0)
MCH: 28.4 pg (ref 26.0–34.0)
MCHC: 33.3 g/dL (ref 30.0–36.0)
MCV: 85.5 fL (ref 78.0–100.0)
PLATELETS: 301 10*3/uL (ref 150–400)
RBC: 4.82 MIL/uL (ref 3.87–5.11)
RDW: 14.3 % (ref 11.5–15.5)
WBC: 6.1 10*3/uL (ref 4.0–10.5)

## 2014-02-06 NOTE — Patient Instructions (Signed)
Your physician has requested that you have an echocardiogram. Echocardiography is a painless test that uses sound waves to create images of your heart. It provides your doctor with information about the size and shape of your heart and how well your heart's chambers and valves are working. This procedure takes approximately one hour. There are no restrictions for this procedure.  Your physician has requested that you have a carotid duplex. This test is an ultrasound of the carotid arteries in your neck. It looks at blood flow through these arteries that supply the brain with blood. Allow one hour for this exam. There are no restrictions or special instructions.   Your physician recommends that you return for lab work fasting. Do not eat or drink after midnight the night prior to going to the lab.  Your physician wants you to follow-up in: 1 year or sooner if needed with Dr. Tresa EndoKelly. You will receive a reminder letter in the mail two months in advance. If you don't receive a letter, please call our office to schedule the follow-up appointment.

## 2014-02-06 NOTE — Progress Notes (Signed)
Patient ID: Eugenia Pancoastracey R Blackson, female   DOB: 05/11/1965, 48 y.o.   MRN: [id number]     HPI: Eugenia Pancoastracey R Wysong is a 48 y.o. female who presents to the office for one year cardiology evaluation.   Ms. Creta LevinStallings has a history of hyperlipidemia, GERD, mild obesity, and has experienced episodes of atypical chest pain. She had previously undergone cardiac catheterization in 2008 which showed normal coronary arteries. She does note stress induced chest discomfort without exertional precipitation. She remotely had taken Crestor for hyperlipidemia but is not on a statin since 2010. In February 2012 her LDL cholesterol was 116.  She has history of obesity and has not been very successful weight loss.  She also has a history of anxiety for which she had been prescribed Zoloft.  In early April 2015, she took a new type of weight loss pill (course in the).  Took some vitamins.  She developed symptoms worrisome for possible TIA leading to hospitalization on 07/26/2013.  She was noted have lethargy, confusion, and slurred speech.  She was also diagnosed with dehydration and UTI.  He tells me she underwent a CT and MRI.  She was seen by neurology and saw Dr. Larene PickettPenny Umali in follow-up office visit on 08/10/2013.  Ultimately, he did not believe this represented a TIA.  She denies recent chest pain.  She denies any weight loss.  She presents for evaluation.  Past Medical History  Diagnosis Date  . Anxiety   . Obesity (BMI 30-39.9) 05/01/2008 OV  . GERD (gastroesophageal reflux disease) 05/01/2010 OV  . Hyperlipidemia 05/01/2010 OV    Past Surgical History  Procedure Laterality Date  . Abdominal hysterectomy    . Tonsillectomy    . Cardiac catheterization  06/13/2006  . Coma  1980's    for seven days    Allergies  Allergen Reactions  . Hydrocodone-Acetaminophen Nausea And Vomiting    Current Outpatient Prescriptions  Medication Sig Dispense Refill  . aspirin EC 81 MG tablet Take 81 mg by mouth every  other day. Pain      . Carboxymethylcellul-Glycerin (REFRESH OPTIVE OP) Apply to eye every other day.      Marland Kitchen. GARLIC PO Take 1 tablet by mouth every other day.       . ibuprofen (ADVIL,MOTRIN) 200 MG tablet Take 200 mg by mouth every 6 (six) hours as needed. Pain,as needed      . Multiple Vitamin (MULTIVITAMIN) capsule Take 1 capsule by mouth every other day.      . Omega-3 Fatty Acids (FISH OIL PO) Take 1 tablet by mouth every other day.       Marland Kitchen. OVER THE COUNTER MEDICATION Place 1 drop into both eyes 4 (four) times daily. Eye drop      . OVER THE COUNTER MEDICATION Equate allergy      . sertraline (ZOLOFT) 25 MG tablet Take 25 mg by mouth daily.       No current facility-administered medications for this visit.    History   Social History  . Marital Status: Married    Spouse Name: Merlyn AlbertFred    Number of Children: 2  . Years of Education: 13   Occupational History  .      [employer]   Social History Main Topics  . Smoking status: Never Smoker   . Smokeless tobacco: Never Used  . Alcohol Use: Yes     Comment: occasional  . Drug Use: No  . Sexual Activity: Not on file  Other Topics Concern  . Not on file   Social History Narrative   Patient is right handed, resides in home with husband   Additional social history is notable that she is married for 17 years. 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 She does walk approximately 2 days per week for up to 30 minutes. There is no tobacco or alcohol use.  Family History  Problem Relation Age of Onset  . Scleroderma Mother    ROS General: Negative; No fevers, chills, or night sweats;  HEENT: Negative; No changes in vision or hearing, sinus congestion, difficulty swallowing Pulmonary: Negative; No cough, wheezing, shortness of breath, hemoptysis Cardiovascular: Negative; No chest pain, presyncope, syncope, palpitations GI: Negative; No nausea, vomiting, diarrhea, or abdominal pain GU: Negative; No dysuria,  hematuria, or difficulty voiding Musculoskeletal: Negative; no myalgias, joint pain, or weakness Hematologic/Oncology: Negative; no easy bruising, bleeding Endocrine: Negative; no heat/cold intolerance; no diabetes Neuro: Negative; no changes in balance, headaches Skin: Negative; No rashes or skin lesions Psychiatric: Positive for anxiety; no, depression Sleep: Negative; No snoring, daytime sleepiness, hypersomnolence, bruxism, restless legs, hypnogognic hallucinations, no cataplexy Other comprehensive 14 point system review is negative.   PE BP 114/70  Pulse 62  Ht 5' 4.5" (1.638 m)  Wt 185 lb 4.8 oz (84.052 kg)  BMI 31.33 kg/m2  General: Alert, oriented, no distress.  Skin: normal turgor, no rashes HEENT: Normocephalic, atraumatic. Pupils round and reactive; sclera anicteric;no lid lag.  Nose without nasal septal hypertrophy Mouth/Parynx benign; Mallinpatti scale 2/3 Neck: No JVD, no carotid bruits, with normal carotid upstroke Lungs: clear to ausculatation and percussion; no wheezing or rales Chest wall: Nontender to palpation Heart: RRR, s1 s2 normal  .  Faint 1/6 systolic murmur.  No S3 or S4 gallop.  No diastolic murmur.  No rubs, thrills or heaves. Abdomen: Mild central adiposity ; soft, nontender; no hepatosplenomehaly, BS+; abdominal aorta nontender and not dilated by palpation. Back: No CVA tenderness Pulses 2+ Extremities: no clubbing cyanosis or edema, Homan's sign negative  Neurologic: grossly nonfocal Psychologic: normal affect and mood.  ECG (independently read by me): Normal sinus rhythm at 62 bpm.  Normal intervals.  No ST segment changes.  Prior October 2014 ECG: Normal sinus rhythm at 63 beats per minute.PR 154 ms; QTc interval 429 ms   LABS:  BMET    Component Value Date/Time   NA 142 07/27/2013 0443   K 4.3 07/27/2013 0443   CL 108 07/27/2013 0443   CO2 21 07/27/2013 0443   GLUCOSE 90 07/27/2013 0443   BUN 12 07/27/2013 0443   CREATININE 0.78 07/27/2013  0443   CREATININE 0.84 02/15/2013 0910   CALCIUM 8.8 07/27/2013 0443   GFRNONAA >90 07/27/2013 0443   GFRAA >90 07/27/2013 0443     Hepatic Function Panel     Component Value Date/Time   PROT 6.1 07/27/2013 0443   ALBUMIN 3.6 07/27/2013 0443   AST 22 07/27/2013 0443   ALT 23 07/27/2013 0443   ALKPHOS 70 07/27/2013 0443   BILITOT 0.3 07/27/2013 0443   BILIDIR <0.2 07/26/2013 1215   IBILI NOT CALCULATED 07/26/2013 1215     CBC    Component Value Date/Time   WBC 6.6 07/27/2013 0443   RBC 4.33 07/27/2013 0443   HGB 12.4 07/27/2013 0443   HCT 38.1 07/27/2013 0443   PLT 241 07/27/2013 0443   MCV 88.0 07/27/2013 0443   MCH 28.6 07/27/2013 0443   MCHC 32.5 07/27/2013  0443   RDW 13.5 07/27/2013 0443   LYMPHSABS 3.3 07/27/2013 0443   MONOABS 0.6 07/27/2013 0443   EOSABS 0.1 07/27/2013 0443   BASOSABS 0.0 07/27/2013 0443     BNP No results found for this basename: probnp    Lipid Panel     Component Value Date/Time   TRIG 72 02/15/2013 0910   LDLCALC 109* 02/15/2013 0910     RADIOLOGY: No results found.    ASSESSMENT AND PLAN:  Ms. Jani Gravelracy Lerman is a 48 years old who has a history of hyperlipidemia, GERD, mild obesity, and in the past had undergone cardiac catheterization in March 2008 after [facility] was [facility] with recurrent chest pain for definitive cardiac catheterization. Her coronary arteries were documented to be normal. She has a history of hyperlipidemia in the past apparently developed some joint discomfort on Crestor.  She has not been successful with weight loss and remains mildly obese with a body mass index of 31.33.  There was no evidence for orthostatic hypotension with blood pressure 160/70 supine, 118/70 standing.  When taken by me today.  I reviewed her hospitalization from April and her follow-up office visit with neurology.  Presently she's not having any chest pain symptoms.  Her blood pressure is stable.  We discussed the importance of avoiding  some of the over-the-counter supplements.  We discussed importance of weight loss and exercise.  She never has had an echo Doppler study.  I will schedule her for an echo Doppler evaluation to make certain she does not have any suggestion of PFO or other valvular issues.  I will also schedule her for carotid duplex imaging to make certain there are no carotid plaques or any suggestion of vertebral insufficiency.  Completes her laboratory will be obtained including chemistry, CBC, lipid and thyroid studies.  I will contact her regarding the results.  If the above studies are fairly normal.  I will just see her back in the office in 1 year evaluation.  However, if abnormalities are demonstrated she will be seen sooner for further evaluation.   Lennette Biharihomas A. Raejean Swinford, MD, [facility] Center[facility]ACC  02/06/2014 8:05 AM

## 2014-02-13 ENCOUNTER — Encounter (HOSPITAL_COMMUNITY)

## 2014-02-13 ENCOUNTER — Ambulatory Visit (HOSPITAL_COMMUNITY)

## 2014-02-15 ENCOUNTER — Ambulatory Visit (HOSPITAL_COMMUNITY)
Admission: RE | Admit: 2014-02-15 | Discharge: 2014-02-15 | Disposition: A | Discharge status: Home / Self Care | Source: Ambulatory Visit | Attending: Cardiology | Admitting: Cardiology

## 2014-02-15 DIAGNOSIS — G459 Transient cerebral ischemic attack, unspecified: Secondary | ICD-10-CM | POA: Diagnosis not present

## 2014-02-15 DIAGNOSIS — G458 Other transient cerebral ischemic attacks and related syndromes: Secondary | ICD-10-CM | POA: Diagnosis not present

## 2014-02-15 DIAGNOSIS — E785 Hyperlipidemia, unspecified: Secondary | ICD-10-CM | POA: Insufficient documentation

## 2014-02-15 DIAGNOSIS — R0989 Other specified symptoms and signs involving the circulatory and respiratory systems: Secondary | ICD-10-CM | POA: Diagnosis not present

## 2014-02-15 NOTE — Progress Notes (Signed)
2D Echo Performed 02/15/2014    Shantoya Geurts, RCS  

## 2014-02-15 NOTE — Progress Notes (Signed)
Carotid Duplex Completed. °Brianna L Mazza,RVT °

## 2014-02-26 ENCOUNTER — Telehealth: Payer: Self-pay | Admitting: Cardiovascular Disease

## 2014-02-26 ENCOUNTER — Encounter: Payer: Self-pay | Admitting: *Deleted

## 2014-02-26 NOTE — Telephone Encounter (Signed)
Left message secure voicemail - results of carotid and echo - normal limits Any question may call back

## 2014-02-26 NOTE — Telephone Encounter (Signed)
RETURN CALL NO ANSWER - VOICEMAIL

## 2014-02-26 NOTE — Telephone Encounter (Signed)
Returning your call. °

## 2014-02-26 NOTE — Telephone Encounter (Signed)
Calling to get the results of her test . Please call    Thanks

## 2014-10-22 ENCOUNTER — Encounter: Payer: Self-pay | Admitting: *Deleted

## 2014-10-29 ENCOUNTER — Encounter: Payer: Self-pay | Admitting: Cardiovascular Disease

## 2015-11-05 ENCOUNTER — Ambulatory Visit (HOSPITAL_COMMUNITY)
Admission: RE | Admit: 2015-11-05 | Discharge: 2015-11-05 | Disposition: A | Discharge status: Home / Self Care | Source: Ambulatory Visit | Attending: Internal Medicine | Admitting: Internal Medicine

## 2015-11-05 ENCOUNTER — Other Ambulatory Visit (HOSPITAL_COMMUNITY): Payer: Self-pay | Admitting: Internal Medicine

## 2015-11-05 DIAGNOSIS — M79671 Pain in right foot: Secondary | ICD-10-CM | POA: Insufficient documentation

## 2015-11-05 DIAGNOSIS — T1490XA Injury, unspecified, initial encounter: Secondary | ICD-10-CM

## 2015-11-05 DIAGNOSIS — M25474 Effusion, right foot: Secondary | ICD-10-CM | POA: Diagnosis not present

## 2017-06-02 ENCOUNTER — Encounter: Payer: Self-pay | Admitting: Internal Medicine

## 2017-07-28 ENCOUNTER — Other Ambulatory Visit: Payer: Self-pay

## 2017-07-28 ENCOUNTER — Encounter: Payer: Self-pay | Admitting: Gastroenterology

## 2017-07-28 ENCOUNTER — Ambulatory Visit (INDEPENDENT_AMBULATORY_CARE_PROVIDER_SITE_OTHER): Admitting: Gastroenterology

## 2017-07-28 ENCOUNTER — Telehealth: Payer: Self-pay

## 2017-07-28 DIAGNOSIS — Z9189 Other specified personal risk factors, not elsewhere classified: Secondary | ICD-10-CM | POA: Diagnosis not present

## 2017-07-28 DIAGNOSIS — Z1211 Encounter for screening for malignant neoplasm of colon: Secondary | ICD-10-CM

## 2017-07-28 MED ORDER — PEG 3350-KCL-NA BICARB-NACL 420 G PO SOLR: 4000.0000 mL | ORAL | 0 refills | Status: DC

## 2017-07-28 NOTE — Progress Notes (Signed)
CC'D TO PCP °

## 2017-07-28 NOTE — Telephone Encounter (Signed)
Called and informed pt of pre-op appt 09/22/17 at 12:45pm. Letter mailed

## 2017-07-28 NOTE — Progress Notes (Signed)
Primary Care Physician:  Benita Stabile, MD  Primary Gastroenterologist:  Roetta Sessions, MD   Chief Complaint  Patient presents with  . Consult    TCS    HPI:  Leah Henson is a 52 y.o. female here the request of Dr. Margo Aye for first ever screening colonoscopy.  Due to patient's use of antidepressants/anxiolytics she has made an office visit for evaluation/determination of mode of sedation for colonoscopy.  She tells me that she had issues during her hysterectomy back around 13 years ago, became hypotensive and states she almost died.  She states is related to too much anesthesia being provided.  No constipation, diarrhea, melena, brbpr, abd pain. Rare heartburn. No dysphagia. On zoloft due to menopause. Recent increase sister's illness. Ativan 3 in the past 3 months. Also related to anxiety/stress from sister's illness. On Zoloft off/on for 13 years.  She has a significant amount of his anxiety regarding the procedure and sedation.     Current Outpatient Medications  Medication Sig Dispense Refill  . aspirin EC 81 MG tablet Take 81 mg by mouth every other day. Pain    . Carboxymethylcellul-Glycerin (REFRESH OPTIVE OP) Apply to eye every other day.    . ibuprofen (ADVIL,MOTRIN) 200 MG tablet Take 200 mg by mouth every 6 (six) hours as needed. Pain,as needed    . OVER THE COUNTER MEDICATION Equate allergy once daily    . sertraline (ZOLOFT) 25 MG tablet Take 50 mg by mouth daily.     Marland Kitchen LORazepam (ATIVAN) 0.5 MG tablet      No current facility-administered medications for this visit.     Allergies as of 07/28/2017 - Review Complete 07/28/2017  Allergen Reaction Noted  . Hydrocodone-acetaminophen Nausea And Vomiting     Past Medical History:  Diagnosis Date  . Anxiety   . GERD (gastroesophageal reflux disease) 05/01/2010 OV  . Hyperlipidemia 05/01/2010 OV  . Obesity (BMI 30-39.9) 05/01/2008 OV  . TIA (transient ischemic attack)     Past Surgical History:  Procedure Laterality  Date  . ABDOMINAL HYSTERECTOMY  2006  . CARDIAC CATHETERIZATION  06/13/2006  . coma  1980's   for seven days  . TONSILLECTOMY      Family History  Problem Relation Age of Onset  . Scleroderma Mother   . Heart disease Mother   . Heart disease Father   . Breast cancer Sister   . Colon cancer Neg Hx     Social History   Socioeconomic History  . Marital status: Married    Spouse name: Leah Henson  . Number of children: 2  . Years of education: 33  . Highest education level: Not on file  Occupational History    Employer: [employer]    Comment: [employer]  Social Needs  . Financial resource strain: Not on file  . Food insecurity:    Worry: Not on file    Inability: Not on file  . Transportation needs:    Medical: Not on file    Non-medical: Not on file  Tobacco Use  . Smoking status: Never Smoker  . Smokeless tobacco: Never Used  Substance and Sexual Activity  . Alcohol use: Yes    Comment: occasional  . Drug use: No  . Sexual activity: Not on file  Lifestyle  . Physical activity:    Days per week: Not on file    Minutes per session: Not on file  . Stress: Not on file  Relationships  . Social connections:  Talks on phone: Not on file    Gets together: Not on file    Attends religious service: Not on file    Active member of club or organization: Not on file    Attends meetings of clubs or organizations: Not on file    Relationship status: Not on file  . Intimate partner violence:    Fear of current or ex partner: Not on file    Emotionally abused: Not on file    Physically abused: Not on file    Forced sexual activity: Not on file  Other Topics Concern  . Not on file  Social History Narrative   Patient is right handed, resides in home with husband      ROS:  General: Negative for anorexia, weight loss, fever, chills, fatigue, weakness. Eyes: Negative for vision changes.  ENT: Negative for hoarseness, difficulty swallowing , nasal  congestion. CV: Negative for chest pain, angina, palpitations, dyspnea on exertion, peripheral edema.  Respiratory: Negative for dyspnea at rest, dyspnea on exertion, cough, sputum, wheezing.  GI: See history of present illness. GU:  Negative for dysuria, hematuria, urinary incontinence, urinary frequency, nocturnal urination.  MS: Negative for joint pain, low back pain.  Derm: Negative for rash or itching.  Neuro: Negative for weakness, abnormal sensation, seizure, frequent headaches, memory loss, confusion.  Psych: Negative for depression, suicidal ideation, hallucinations.  Anxiety Endo: Negative for unusual weight change.  Heme: Negative for bruising or bleeding. Allergy: Negative for rash or hives.    Physical Examination:  BP 133/80   Pulse 71   Temp (!) 97 F (36.1 C) (Oral)   Ht 5' 4.5" (1.638 m)   Wt 201 lb 9.6 oz (91.4 kg)   BMI 34.07 kg/m    General: Well-nourished, well-developed in no acute distress.  Head: Normocephalic, atraumatic.   Eyes: Conjunctiva pink, no icterus. Mouth: Oropharyngeal mucosa moist and pink , no lesions erythema or exudate. Neck: Supple without thyromegaly, masses, or lymphadenopathy.  Lungs: Clear to auscultation bilaterally.  Heart: Regular rate and rhythm, no murmurs rubs or gallops.  Abdomen: Bowel sounds are normal, nontender, nondistended, no hepatosplenomegaly or masses, no abdominal bruits or    hernia , no rebound or guarding.   Rectal: Not performed Extremities: No lower extremity edema. No clubbing or deformities.  Neuro: Alert and oriented x 4 , grossly normal neurologically.  Skin: Warm and dry, no rash or jaundice.   Psych: Alert and cooperative, normal mood and affect.  Labs: Labs from 05/18/2017 White blood cell count 6500, hemoglobin 13.8, hematocrit 42, MCV 88, platelets 281,000, glucose 89, BUN 11, creatinine 0.99, total bilirubin 0.4, alkaline phosphatase 78, AST 27, ALT 29, albumin 4.9, TSH 2.080  Imaging Studies: No  results found.

## 2017-07-28 NOTE — Assessment & Plan Note (Addendum)
Very pleasant 52 year old female presenting to schedule first ever colonoscopy.  She denies any significant GI complaints.  She recalls having complication with anesthesia at time of hysterectomy 13 years ago reporting severe hypotension in the setting of "too much anesthesia" per patient.  Would advise deep sedation with assistance of anesthesiology at time of her colonoscopy give her chronic use of Zoloft and reported anesthesia concerns.  I have discussed the risks, alternatives, benefits with regards to but not limited to the risk of reaction to medication, bleeding, infection, perforation and the patient is agreeable to proceed. Written consent to be obtained.

## 2017-07-28 NOTE — Patient Instructions (Signed)
1. Colonoscopy as scheduled. See separate instructions.  

## 2017-09-20 NOTE — Patient Instructions (Signed)
Leah Henson  09/20/2017     @PREFPERIOPPHARMACY @   Your procedure is scheduled on  09/29/2017 .  Report to [facility] at  845   A.M.  Call this number if you have problems the morning of surgery:  [phone]   Remember:  No food after midnight.  You may drink clear liquids until (follow the diet and prep instructions given to you by Dr Luvenia Starchourk's office) .  Clear liquids allowed are:                    Water, Juice (non-citric and without pulp), Carbonated beverages, Clear Tea, Black Coffee only, Plain Jell-O only, Gatorade and Plain Popsicles only    Take these medicines the morning of surgery with A SIP OF WATER ativan, zoloft    Do not wear jewelry, make-up or nail polish.  Do not wear lotions, powders, or perfumes, or deodorant.  Do not shave 48 hours prior to surgery.  Men may shave face and neck.  Do not bring valuables to the hospital.  [facility] is not responsible for any belongings or valuables.  Contacts, dentures or bridgework may not be worn into surgery.  Leave your suitcase in the car.  After surgery it may be brought to your room.  For patients admitted to the hospital, discharge time will be determined by your treatment team.  Patients discharged the day of surgery will not be allowed to drive home.   Name and phone number of your driver:   family Special instructions:  Follow the diet and prep instructions given to you by Dr Luvenia Starchourk's office.  Please read over the following fact sheets that you were given. Anesthesia Post-op Instructions and Care and Recovery After Surgery       Colonoscopy, Adult A colonoscopy is an exam to look at the large intestine. It is done to check for problems, such as:  Lumps (tumors).  Growths (polyps).  Swelling (inflammation).  Bleeding.  What happens before the procedure? Eating and drinking Follow instructions from your doctor about eating and drinking. These instructions may include:  A few  days before the procedure - follow a low-fiber diet. ? Avoid nuts. ? Avoid seeds. ? Avoid dried fruit. ? Avoid raw fruits. ? Avoid vegetables.  1-3 days before the procedure - follow a clear liquid diet. Avoid liquids that have red or purple dye. Drink only clear liquids, such as: ? Clear broth or bouillon. ? Black coffee or tea. ? Clear juice. ? Clear soft drinks or sports drinks. ? Gelatin dessert. ? Popsicles.  On the day of the procedure - do not eat or drink anything during the 2 hours before the procedure.  Bowel prep If you were prescribed an oral bowel prep:  Take it as told by your doctor. Starting the day before your procedure, you will need to drink a lot of liquid. The liquid will cause you to poop (have bowel movements) until your poop is almost clear or light green.  If your skin or butt gets irritated from diarrhea, you may: ? Wipe the area with wipes that have medicine in them, such as adult wet wipes with aloe and vitamin E. ? Put something on your skin that soothes the area, such as petroleum jelly.  If you throw up (vomit) while drinking the bowel prep, take a break for up to 60 minutes. Then begin the bowel prep again. If you keep  throwing up and you cannot take the bowel prep without throwing up, call your doctor.  General instructions  Ask your doctor about changing or stopping your normal medicines. This is important if you take diabetes medicines or blood thinners.  Plan to have someone take you home from the hospital or clinic. What happens during the procedure?  An IV tube may be put into one of your veins.  You will be given medicine to help you relax (sedative).  To reduce your risk of infection: ? Your doctors will wash their hands. ? Your anal area will be washed with soap.  You will be asked to lie on your side with your knees bent.  Your doctor will get a long, thin, flexible tube ready. The tube will have a camera and a light on the  end.  The tube will be put into your anus.  The tube will be gently put into your large intestine.  Air will be delivered into your large intestine to keep it open. You may feel some pressure or cramping.  The camera will be used to take photos.  A small tissue sample may be removed from your body to be looked at under a microscope (biopsy). If any possible problems are found, the tissue will be sent to a lab for testing.  If small growths are found, your doctor may remove them and have them checked for cancer.  The tube that was put into your anus will be slowly removed. The procedure may vary among doctors and hospitals. What happens after the procedure?  Your doctor will check on you often until the medicines you were given have worn off.  Do not drive for 24 hours after the procedure.  You may have a small amount of blood in your poop.  You may pass gas.  You may have mild cramps or bloating in your belly (abdomen).  It is up to you to get the results of your procedure. Ask your doctor, or the department performing the procedure, when your results will be ready. This information is not intended to replace advice given to you by your health care provider. Make sure you discuss any questions you have with your health care provider. Document Released: 05/01/2010 Document Revised: 01/28/2016 Document Reviewed: 06/10/2015 Elsevier Interactive Patient Education  2017 [employer]  Colonoscopy, Adult, Care After This sheet gives you information about how to care for yourself after your procedure. Your health care provider may also give you more specific instructions. If you have problems or questions, contact your health care provider. What can I expect after the procedure? After the procedure, it is common to have:  A small amount of blood in your stool for 24 hours after the procedure.  Some gas.  Mild abdominal cramping or bloating.  Follow these instructions at  home: General instructions   For the first 24 hours after the procedure: ? Do not drive or use machinery. ? Do not sign important documents. ? Do not drink alcohol. ? Do your regular daily activities at a slower pace than normal. ? Eat soft, easy-to-digest foods. ? Rest often.  Take over-the-counter or prescription medicines only as told by your health care provider.  It is up to you to get the results of your procedure. Ask your health care provider, or the department performing the procedure, when your results will be ready. Relieving cramping and bloating  Try walking around when you have cramps or feel bloated.  Apply heat to your  abdomen as told by your health care provider. Use a heat source that your health care provider recommends, such as a moist heat pack or a heating pad. ? Place a towel between your skin and the heat source. ? Leave the heat on for 20-30 minutes. ? Remove the heat if your skin turns bright red. This is especially important if you are unable to feel pain, heat, or cold. You may have a greater risk of getting burned. Eating and drinking  Drink enough fluid to keep your urine clear or pale yellow.  Resume your normal diet as instructed by your health care provider. Avoid heavy or fried foods that are hard to digest.  Avoid drinking alcohol for as long as instructed by your health care provider. Contact a health care provider if:  You have blood in your stool 2-3 days after the procedure. Get help right away if:  You have more than a small spotting of blood in your stool.  You pass large blood clots in your stool.  Your abdomen is swollen.  You have nausea or vomiting.  You have a fever.  You have increasing abdominal pain that is not relieved with medicine. This information is not intended to replace advice given to you by your health care provider. Make sure you discuss any questions you have with your health care provider. Document Released:  11/11/2003 Document Revised: 12/22/2015 Document Reviewed: 06/10/2015 Elsevier Interactive Patient Education  2018 [place] Anesthesia is a term that refers to techniques, procedures, and medicines that help a person stay safe and comfortable during a medical procedure. Monitored anesthesia care, or sedation, is one type of anesthesia. Your anesthesia specialist may recommend sedation if you will be having a procedure that does not require you to be unconscious, such as:  Cataract surgery.  A dental procedure.  A biopsy.  A colonoscopy.  During the procedure, you may receive a medicine to help you relax (sedative). There are three levels of sedation:  Mild sedation. At this level, you may feel awake and relaxed. You will be able to follow directions.  Moderate sedation. At this level, you will be sleepy. You may not remember the procedure.  Deep sedation. At this level, you will be asleep. You will not remember the procedure.  The more medicine you are given, the deeper your level of sedation will be. Depending on how you respond to the procedure, the anesthesia specialist may change your level of sedation or the type of anesthesia to fit your needs. An anesthesia specialist will monitor you closely during the procedure. Let your health care provider know about:  Any allergies you have.  All medicines you are taking, including vitamins, herbs, eye drops, creams, and over-the-counter medicines.  Any use of steroids (by mouth or as a cream).  Any problems you or family members have had with sedatives and anesthetic medicines.  Any blood disorders you have.  Any surgeries you have had.  Any medical conditions you have, such as sleep apnea.  Whether you are pregnant or may be pregnant.  Any use of cigarettes, alcohol, or street drugs. What are the risks? Generally, this is a safe procedure. However, problems may occur, including:  Getting too  much medicine (oversedation).  Nausea.  Allergic reaction to medicines.  Trouble breathing. If this happens, a breathing tube may be used to help with breathing. It will be removed when you are awake and breathing on your own.  Heart trouble.  Lung trouble.  Before the procedure Staying hydrated Follow instructions from your health care provider about hydration, which may include:  Up to 2 hours before the procedure - you may continue to drink clear liquids, such as water, clear fruit juice, black coffee, and plain tea.  Eating and drinking restrictions Follow instructions from your health care provider about eating and drinking, which may include:  8 hours before the procedure - stop eating heavy meals or foods such as meat, fried foods, or fatty foods.  6 hours before the procedure - stop eating light meals or foods, such as toast or cereal.  6 hours before the procedure - stop drinking milk or drinks that contain milk.  2 hours before the procedure - stop drinking clear liquids.  Medicines Ask your health care provider about:  Changing or stopping your regular medicines. This is especially important if you are taking diabetes medicines or blood thinners.  Taking medicines such as aspirin and ibuprofen. These medicines can thin your blood. Do not take these medicines before your procedure if your health care provider instructs you not to.  Tests and exams  You will have a physical exam.  You may have blood tests done to show: ? How well your kidneys and liver are working. ? How well your blood can clot.  General instructions  Plan to have someone take you home from the hospital or clinic.  If you will be going home right after the procedure, plan to have someone with you for 24 hours.  What happens during the procedure?  Your blood pressure, heart rate, breathing, level of pain and overall condition will be monitored.  An IV tube will be inserted into one of  your veins.  Your anesthesia specialist will give you medicines as needed to keep you comfortable during the procedure. This may mean changing the level of sedation.  The procedure will be performed. After the procedure  Your blood pressure, heart rate, breathing rate, and blood oxygen level will be monitored until the medicines you were given have worn off.  Do not drive for 24 hours if you received a sedative.  You may: ? Feel sleepy, clumsy, or nauseous. ? Feel forgetful about what happened after the procedure. ? Have a sore throat if you had a breathing tube during the procedure. ? Vomit. This information is not intended to replace advice given to you by your health care provider. Make sure you discuss any questions you have with your health care provider. Document Released: 12/23/2004 Document Revised: 09/05/2015 Document Reviewed: 07/20/2015 Elsevier Interactive Patient Education  2018 [place], Care After These instructions provide you with information about caring for yourself after your procedure. Your health care provider may also give you more specific instructions. Your treatment has been planned according to current medical practices, but problems sometimes occur. Call your health care provider if you have any problems or questions after your procedure. What can I expect after the procedure? After your procedure, it is common to:  Feel sleepy for several hours.  Feel clumsy and have poor balance for several hours.  Feel forgetful about what happened after the procedure.  Have poor judgment for several hours.  Feel nauseous or vomit.  Have a sore throat if you had a breathing tube during the procedure.  Follow these instructions at home: For at least 24 hours after the procedure:   Do not: ? Participate in activities in which you could fall or become injured. ?  Drive. ? Use heavy machinery. ? Drink alcohol. ? Take sleeping pills  or medicines that cause drowsiness. ? Make important decisions or sign legal documents. ? Take care of children on your own.  Rest. Eating and drinking  Follow the diet that is recommended by your health care provider.  If you vomit, drink water, juice, or soup when you can drink without vomiting.  Make sure you have little or no nausea before eating solid foods. General instructions  Have a responsible adult stay with you until you are awake and alert.  Take over-the-counter and prescription medicines only as told by your health care provider.  If you smoke, do not smoke without supervision.  Keep all follow-up visits as told by your health care provider. This is important. Contact a health care provider if:  You keep feeling nauseous or you keep vomiting.  You feel light-headed.  You develop a rash.  You have a fever. Get help right away if:  You have trouble breathing. This information is not intended to replace advice given to you by your health care provider. Make sure you discuss any questions you have with your health care provider. Document Released: 07/20/2015 Document Revised: 11/19/2015 Document Reviewed: 07/20/2015 Elsevier Interactive Patient Education  [employer].

## 2017-09-21 ENCOUNTER — Other Ambulatory Visit: Payer: Self-pay

## 2017-09-21 ENCOUNTER — Encounter (HOSPITAL_COMMUNITY): Payer: Self-pay

## 2017-09-21 ENCOUNTER — Encounter (HOSPITAL_COMMUNITY)
Admission: RE | Admit: 2017-09-21 | Discharge: 2017-09-21 | Disposition: A | Discharge status: Home / Self Care | Source: Ambulatory Visit | Attending: Internal Medicine | Admitting: Internal Medicine

## 2017-09-21 DIAGNOSIS — Z1211 Encounter for screening for malignant neoplasm of colon: Secondary | ICD-10-CM | POA: Insufficient documentation

## 2017-09-21 DIAGNOSIS — Z01818 Encounter for other preprocedural examination: Secondary | ICD-10-CM | POA: Diagnosis not present

## 2017-09-21 HISTORY — DX: Other seasonal allergic rhinitis: J30.2

## 2017-09-21 LAB — BASIC METABOLIC PANEL
ANION GAP: 6 (ref 5–15)
BUN: 14 mg/dL (ref 6–20)
CALCIUM: 9.4 mg/dL (ref 8.9–10.3)
CO2: 27 mmol/L (ref 22–32)
CREATININE: 0.71 mg/dL (ref 0.44–1.00)
Chloride: 107 mmol/L (ref 101–111)
GFR calc non Af Amer: >60 mL/min (ref >60)
GLUCOSE: 84 mg/dL (ref 65–99)
Potassium: 3.9 mmol/L (ref 3.5–5.1)
Sodium: 140 mmol/L (ref 135–145)

## 2017-09-21 LAB — CBC WITH DIFFERENTIAL/PLATELET
BASOS ABS: 0.1 10*3/uL (ref 0.0–0.1)
BASOS PCT: 1 %
EOS ABS: 0.1 10*3/uL (ref 0.0–0.7)
Eosinophils Relative: 2 %
HCT: 41.8 % (ref 36.0–46.0)
Hemoglobin: 13.4 g/dL (ref 12.0–15.0)
Lymphocytes Relative: 44 %
Lymphs Abs: 3 10*3/uL (ref 0.7–4.0)
MCH: 28.6 pg (ref 26.0–34.0)
MCHC: 32.1 g/dL (ref 30.0–36.0)
MCV: 89.1 fL (ref 78.0–100.0)
MONO ABS: 0.5 10*3/uL (ref 0.1–1.0)
MONOS PCT: 8 %
NEUTROS ABS: 3 10*3/uL (ref 1.7–7.7)
Neutrophils Relative %: 45 %
PLATELETS: 286 10*3/uL (ref 150–400)
RBC: 4.69 MIL/uL (ref 3.87–5.11)
RDW: 13.7 % (ref 11.5–15.5)
WBC: 6.7 10*3/uL (ref 4.0–10.5)

## 2017-09-22 ENCOUNTER — Encounter (HOSPITAL_COMMUNITY)
Admission: RE | Admit: 2017-09-22 | Discharge: 2017-09-22 | Disposition: A | Discharge status: Home / Self Care | Source: Ambulatory Visit | Attending: Internal Medicine | Admitting: Internal Medicine

## 2017-09-29 ENCOUNTER — Ambulatory Visit (HOSPITAL_COMMUNITY): Admitting: Anesthesiology

## 2017-09-29 ENCOUNTER — Ambulatory Visit (HOSPITAL_COMMUNITY)
Admission: RE | Admit: 2017-09-29 | Discharge: 2017-09-29 | Disposition: A | Discharge status: Home / Self Care | Source: Ambulatory Visit | Attending: Internal Medicine | Admitting: Internal Medicine

## 2017-09-29 ENCOUNTER — Encounter (HOSPITAL_COMMUNITY): Payer: Self-pay | Admitting: *Deleted

## 2017-09-29 ENCOUNTER — Encounter (HOSPITAL_COMMUNITY)
Admission: RE | Disposition: A | Discharge status: Home / Self Care | Payer: Self-pay | Source: Ambulatory Visit | Attending: Internal Medicine

## 2017-09-29 DIAGNOSIS — K219 Gastro-esophageal reflux disease without esophagitis: Secondary | ICD-10-CM | POA: Diagnosis not present

## 2017-09-29 DIAGNOSIS — K641 Second degree hemorrhoids: Secondary | ICD-10-CM | POA: Diagnosis not present

## 2017-09-29 DIAGNOSIS — Z8673 Personal history of transient ischemic attack (TIA), and cerebral infarction without residual deficits: Secondary | ICD-10-CM | POA: Insufficient documentation

## 2017-09-29 DIAGNOSIS — Z1211 Encounter for screening for malignant neoplasm of colon: Secondary | ICD-10-CM | POA: Insufficient documentation

## 2017-09-29 DIAGNOSIS — F419 Anxiety disorder, unspecified: Secondary | ICD-10-CM | POA: Diagnosis not present

## 2017-09-29 DIAGNOSIS — Z79899 Other long term (current) drug therapy: Secondary | ICD-10-CM | POA: Insufficient documentation

## 2017-09-29 HISTORY — PX: COLONOSCOPY WITH PROPOFOL: SHX5780

## 2017-09-29 SURGERY — COLONOSCOPY WITH PROPOFOL
Anesthesia: General

## 2017-09-29 MED ORDER — PROPOFOL 10 MG/ML IV BOLUS
INTRAVENOUS | Status: AC
  Filled 2017-09-29: qty 20

## 2017-09-29 MED ORDER — MIDAZOLAM HCL 5 MG/5ML IJ SOLN
INTRAMUSCULAR | Status: DC | PRN
  Administered 2017-09-29: 2 mg via INTRAVENOUS

## 2017-09-29 MED ORDER — FENTANYL CITRATE (PF) 100 MCG/2ML IJ SOLN: 25.0000 ug | INTRAMUSCULAR | Status: DC | PRN

## 2017-09-29 MED ORDER — PROPOFOL 500 MG/50ML IV EMUL
INTRAVENOUS | Status: DC | PRN
  Administered 2017-09-29: 125 ug/kg/min via INTRAVENOUS

## 2017-09-29 MED ORDER — MIDAZOLAM HCL 2 MG/2ML IJ SOLN
INTRAMUSCULAR | Status: AC
  Filled 2017-09-29: qty 2

## 2017-09-29 MED ORDER — CHLORHEXIDINE GLUCONATE CLOTH 2 % EX PADS: 6.0000 | MEDICATED_PAD | Freq: Once | CUTANEOUS | Status: DC

## 2017-09-29 MED ORDER — LACTATED RINGERS IV SOLN
INTRAVENOUS | Status: DC
  Administered 2017-09-29: 1000 mL via INTRAVENOUS

## 2017-09-29 NOTE — H&P (Signed)
@LOGO @   Primary Care Physician:  Benita Stabile, MD Primary Gastroenterologist:  Dr. Jena Gauss  Pre-Procedure History & Physical: HPI:  Leah Henson is a 52 y.o. female is here for a screening colonoscopy. No prior colonoscopy. No bowel symptoms. No family history of colon cancer.  Past Medical History:  Diagnosis Date  . Anxiety   . GERD (gastroesophageal reflux disease) 05/01/2010 OV  . Hyperlipidemia 05/01/2010 OV  . Obesity (BMI 30-39.9) 05/01/2008 OV  . Seasonal allergies   . TIA (transient ischemic attack)     Past Surgical History:  Procedure Laterality Date  . ABDOMINAL HYSTERECTOMY  2006  . CARDIAC CATHETERIZATION  06/13/2006  . coma  1980's   for seven days  . TONSILLECTOMY      Prior to Admission medications   Medication Sig Start Date End Date Taking? Authorizing Provider  aspirin EC 81 MG tablet Take 81 mg by mouth 3 (three) times a week.    Yes [provider]  Carboxymethylcellul-Glycerin (REFRESH OPTIVE OP) Place 1 drop into both eyes 3 (three) times daily as needed (for dry/irritated eyes.).    Yes [provider]  clarithromycin (BIAXIN) 500 MG tablet Take 500 mg by mouth 2 (two) times daily.   Yes [provider]  fexofenadine (ALLEGRA) 180 MG tablet Take 180 mg by mouth daily as needed for allergies.   Yes [provider]  ibuprofen (ADVIL,MOTRIN) 800 MG tablet Take 800 mg by mouth every 8 (eight) hours as needed (FOR PAIN.).   Yes [provider]  LORazepam (ATIVAN) 0.5 MG tablet Take 0.5 mg by mouth 2 (two) times daily as needed (for anxiety).  04/29/17  Yes [provider]  polyethylene glycol-electrolytes (TRILYTE) 420 g solution Take 4,000 mLs by mouth as directed. 07/28/17  Yes Leah Quevedo, Gerrit Friends, MD  sertraline (ZOLOFT) 50 MG tablet Take 50 mg by mouth daily. 09/02/17   [provider]    Allergies as of 07/28/2017 - Review Complete 07/28/2017  Allergen Reaction Noted  .  Hydrocodone-acetaminophen Nausea And Vomiting     Family History  Problem Relation Age of Onset  . Scleroderma Mother   . Heart disease Mother   . Heart disease Father   . Breast cancer Sister   . Colon cancer Neg Hx     Social History   Socioeconomic History  . Marital status: Married    Spouse name: Merlyn Albert  . Number of children: 2  . Years of education: 77  . Highest education level: Not on file  Occupational History    Employer: [employer]    Comment: [employer]  Social Needs  . Financial resource strain: Not on file  . Food insecurity:    Worry: Not on file    Inability: Not on file  . Transportation needs:    Medical: Not on file    Non-medical: Not on file  Tobacco Use  . Smoking status: Never Smoker  . Smokeless tobacco: Never Used  Substance and Sexual Activity  . Alcohol use: Yes    Comment: occasional  . Drug use: No  . Sexual activity: Not on file  Lifestyle  . Physical activity:    Days per week: Not on file    Minutes per session: Not on file  . Stress: Not on file  Relationships  . Social connections:    Talks on phone: Not on file    Gets together: Not on file    Attends religious service: Not on  file    Active member of club or organization: Not on file    Attends meetings of clubs or organizations: Not on file    Relationship status: Not on file  . Intimate partner violence:    Fear of current or ex partner: Not on file    Emotionally abused: Not on file    Physically abused: Not on file    Forced sexual activity: Not on file  Other Topics Concern  . Not on file  Social History Narrative   Patient is right handed, resides in home with husband    Review of Systems: See HPI, otherwise negative ROS  Physical Exam: BP 130/77   Pulse 77   Temp (!) 97.5 F (36.4 C) (Oral)   Resp 20   SpO2 98%  General:   Alert,  Well-developed, well-nourished, pleasant and cooperative in NAD Lungs:  Clear throughout to auscultation.   No  wheezes, crackles, or rhonchi. No acute distress. Heart:  Regular rate and rhythm; no murmurs, clicks, rubs,  or gallops. Abdomen:  Soft, nontender and nondistended. No masses, hepatosplenomegaly or hernias noted. Normal bowel sounds, without guarding, and without rebound.    Impression/Plan: Leah Henson is now here to undergo a screening colonoscopy.  First-ever average risk screening examination.  Risks, benefits, limitations, imponderables and alternatives regarding colonoscopy have been reviewed with the patient. Questions have been answered. All parties agreeable.          Notice:  This dictation was prepared with Dragon dictation along with smaller phrase technology. Any transcriptional errors that result from this process are unintentional and may not be corrected upon review.

## 2017-09-29 NOTE — Anesthesia Postprocedure Evaluation (Signed)
Anesthesia Post Note  Patient: Leah Henson  Procedure(s) Performed: COLONOSCOPY WITH PROPOFOL (N/A )  Patient location during evaluation: PACU Anesthesia Type: MAC Level of consciousness: awake and patient cooperative Pain management: pain level controlled Vital Signs Assessment: post-procedure vital signs reviewed and stable Respiratory status: spontaneous breathing, nonlabored ventilation and respiratory function stable Cardiovascular status: blood pressure returned to baseline Postop Assessment: no apparent nausea or vomiting Anesthetic complications: no     Last Vitals:  Vitals:   09/29/17 0943  BP: 130/77  Pulse: 77  Resp: 20  Temp: (!) 36.4 C  SpO2: 98%    Last Pain:  Vitals:   09/29/17 0943  TempSrc: Oral  PainSc: 0-No pain                 Zamara Cozad J

## 2017-09-29 NOTE — Op Note (Signed)
[facility] Patient Name: Leah Henson Procedure Date: 09/29/2017 10:26 AM MRN: [id number] Date of Birth: 08/27/1965 Attending MD: Gennette Pacobert Michael Nyssa Sayegh , MD CSN: [id number] Age: 52 Admit Type: Outpatient Procedure:                Colonoscopy Indications:              Screening for colorectal malignant neoplasm Providers:                Gennette Pacobert Michael Labrian Torregrossa, MD, Judee ClaraPamela Shreve, RN, Edythe ClarityKelly                            Cox, Technician Referring MD:              Medicines:                Propofol per Anesthesia Complications:            No immediate complications. Estimated Blood Loss:     Estimated blood loss: none. Procedure:                Pre-Anesthesia Assessment:                           - Prior to the procedure, a History and Physical                            was performed, and patient medications and                            allergies were reviewed. The patient's tolerance of                            previous anesthesia was also reviewed. The risks                            and benefits of the procedure and the sedation                            options and risks were discussed with the patient.                            All questions were answered, and informed consent                            was obtained. Prior Anticoagulants: The patient has                            taken no previous anticoagulant or antiplatelet                            agents. ASA Grade Assessment: II - A patient with                            mild systemic disease. After reviewing the risks  and benefits, the patient was deemed in                            satisfactory condition to undergo the procedure.                           After obtaining informed consent, the colonoscope                            was passed under direct vision. Throughout the                            procedure, the patient's blood pressure, pulse, and   oxygen saturations were monitored continuously. The                            EC-[id number] ([id number]) scope was introduced through                            the and advanced to the the cecum, identified by                            appendiceal orifice and ileocecal valve. The                            colonoscopy was performed without difficulty. The                            patient tolerated the procedure well. The quality                            of the bowel preparation was adequate. The                            ileocecal valve, appendiceal orifice, and rectum                            were photographed. Scope In: 10:34:33 AM Scope Out: 10:44:40 AM Scope Withdrawal Time: 0 hours 6 minutes 54 seconds  Total Procedure Duration: 0 hours 10 minutes 7 seconds  Findings:      The perianal and digital rectal examinations were normal.      Non-bleeding internal hemorrhoids were found during retroflexion. The       hemorrhoids were Grade II (internal hemorrhoids that prolapse but reduce       spontaneously).      The exam was otherwise without abnormality on direct and retroflexion       views. Impression:               - Non-bleeding internal hemorrhoids.                           - The examination was otherwise normal on direct                            and retroflexion views.                           -  No specimens collected. Moderate Sedation:      Moderate (conscious) sedation was personally administered by an       anesthesia professional. The following parameters were monitored: oxygen       saturation, heart rate, blood pressure, respiratory rate, EKG, adequacy       of pulmonary ventilation, and response to care. Total physician       intraservice time was 15 minutes. Recommendation:           - Patient has a contact number available for                            emergencies. The signs and symptoms of potential                            delayed complications were  discussed with the                            patient. Return to normal activities tomorrow.                            Written discharge instructions were provided to the                            patient.                           - Resume previous diet.                           - Continue present medications.                           - Repeat colonoscopy in 10 years for screening                            purposes.                           - Return to GI office PRN. Procedure Code(s):        --- Professional ---                           [phone], Colonoscopy, flexible; diagnostic, including                            collection of specimen(s) by brushing or washing,                            when performed (separate procedure) Diagnosis Code(s):        --- Professional ---                           Z12.11, Encounter for screening for malignant                            neoplasm of colon  K64.1, Second degree hemorrhoids CPT copyright 2017 [employer]. All rights reserved. The codes documented in this report are preliminary and upon coder review may  be revised to meet current compliance requirements. Gerrit Friends. Davari Lopes, MD Gennette Pac, MD 09/29/2017 10:53:31 AM This report has been signed electronically. Number of Addenda: 0

## 2017-09-29 NOTE — Transfer of Care (Signed)
Immediate Anesthesia Transfer of Care Note  Patient: Leah Henson  Procedure(s) Performed: COLONOSCOPY WITH PROPOFOL (N/A )  Patient Location: PACU  Anesthesia Type:MAC  Level of Consciousness: awake and patient cooperative  Airway & Oxygen Therapy: Patient Spontanous Breathing  Post-op Assessment: Report given to RN, Post -op Vital signs reviewed and stable and Patient moving all extremities  Post vital signs: Reviewed and stable  Last Vitals:  Vitals Value Taken Time  BP    Temp    Pulse 81 09/29/2017 10:53 AM  Resp    SpO2 100 % 09/29/2017 10:53 AM  Vitals shown include unvalidated device data.  Last Pain:  Vitals:   09/29/17 0943  TempSrc: Oral  PainSc: 0-No pain      Patients Stated Pain Goal: 7 (81/01/75 1025)  Complications: No apparent anesthesia complications

## 2017-09-29 NOTE — Anesthesia Preprocedure Evaluation (Signed)
Anesthesia Evaluation  Patient identified by MRN, date of birth, ID band Patient awake  General Assessment Comment:States was hypotensive on floor after Hysterectomy   Reviewed: Allergy & Precautions, NPO status , Patient's Chart, lab work & pertinent test results  History of Anesthesia Complications (+) Family history of anesthesia reaction  Airway Mallampati: II  TM Distance: >3 FB Neck ROM: Full    Dental no notable dental hx.    Pulmonary neg pulmonary ROS,    Pulmonary exam normal breath sounds clear to auscultation       Cardiovascular Exercise Tolerance: Good + Past MI  negative cardio ROS Normal cardiovascular examI Rhythm:Regular Rate:Normal  Mild MI  Was cathed -no interventions  States good ET   Neuro/Psych Anxiety tia 2016 - no residual  No events since  TIAnegative neurological ROS  negative psych ROS   GI/Hepatic negative GI ROS, Neg liver ROS, GERD  Medicated and Controlled,  Endo/Other  negative endocrine ROS  Renal/GU negative Renal ROS  negative genitourinary   Musculoskeletal negative musculoskeletal ROS (+)   Abdominal   Peds negative pediatric ROS (+)  Hematology negative hematology ROS (+)   Anesthesia Other Findings   Reproductive/Obstetrics negative OB ROS                             Anesthesia Physical Anesthesia Plan  ASA: II  Anesthesia Plan: General   Post-op Pain Management:    Induction: Intravenous  PONV Risk Score and Plan:   Airway Management Planned: Simple Face Mask  Additional Equipment:   Intra-op Plan:   Post-operative Plan:   Informed Consent: I have reviewed the patients History and Physical, chart, labs and discussed the procedure including the risks, benefits and alternatives for the proposed anesthesia with the patient or authorized representative who has indicated his/her understanding and acceptance.   Dental advisory  given  Plan Discussed with: CRNA  Anesthesia Plan Comments:         Anesthesia Quick Evaluation

## 2017-09-29 NOTE — Discharge Instructions (Signed)

## 2017-10-04 ENCOUNTER — Encounter (HOSPITAL_COMMUNITY): Payer: Self-pay | Admitting: Internal Medicine

## 2018-09-10 ENCOUNTER — Emergency Department (HOSPITAL_COMMUNITY)
Admission: EM | Admit: 2018-09-10 | Discharge: 2018-09-10 | Disposition: A | Discharge status: Home / Self Care | Attending: Emergency Medicine | Admitting: Emergency Medicine

## 2018-09-10 ENCOUNTER — Emergency Department (HOSPITAL_COMMUNITY)

## 2018-09-10 ENCOUNTER — Other Ambulatory Visit: Payer: Self-pay

## 2018-09-10 ENCOUNTER — Encounter (HOSPITAL_COMMUNITY): Payer: Self-pay

## 2018-09-10 DIAGNOSIS — I10 Essential (primary) hypertension: Secondary | ICD-10-CM | POA: Diagnosis not present

## 2018-09-10 DIAGNOSIS — Z7982 Long term (current) use of aspirin: Secondary | ICD-10-CM | POA: Diagnosis not present

## 2018-09-10 DIAGNOSIS — R5383 Other fatigue: Secondary | ICD-10-CM | POA: Diagnosis not present

## 2018-09-10 DIAGNOSIS — Z8673 Personal history of transient ischemic attack (TIA), and cerebral infarction without residual deficits: Secondary | ICD-10-CM | POA: Insufficient documentation

## 2018-09-10 DIAGNOSIS — F419 Anxiety disorder, unspecified: Secondary | ICD-10-CM | POA: Diagnosis not present

## 2018-09-10 DIAGNOSIS — Z79899 Other long term (current) drug therapy: Secondary | ICD-10-CM | POA: Diagnosis not present

## 2018-09-10 DIAGNOSIS — R531 Weakness: Secondary | ICD-10-CM | POA: Diagnosis present

## 2018-09-10 HISTORY — DX: Essential (primary) hypertension: I10

## 2018-09-10 LAB — COMPREHENSIVE METABOLIC PANEL
ALT: 37 U/L (ref 0–44)
AST: 28 U/L (ref 15–41)
Albumin: 4.5 g/dL (ref 3.5–5.0)
Alkaline Phosphatase: 85 U/L (ref 38–126)
Anion gap: 11 (ref 5–15)
BUN: 11 mg/dL (ref 6–20)
CO2: 20 mmol/L — ABNORMAL LOW (ref 22–32)
Calcium: 9.3 mg/dL (ref 8.9–10.3)
Chloride: 110 mmol/L (ref 98–111)
Creatinine, Ser: 0.74 mg/dL (ref 0.44–1.00)
GFR calc Af Amer: >60 mL/min (ref >60)
GFR calc non Af Amer: >60 mL/min (ref >60)
Glucose, Bld: 118 mg/dL — ABNORMAL HIGH (ref 70–99)
Potassium: 4 mmol/L (ref 3.5–5.1)
Sodium: 141 mmol/L (ref 135–145)
Total Bilirubin: 0.3 mg/dL (ref 0.3–1.2)
Total Protein: 7.5 g/dL (ref 6.5–8.1)

## 2018-09-10 LAB — URINALYSIS, ROUTINE W REFLEX MICROSCOPIC
Bacteria, UA: NONE SEEN
Bilirubin Urine: NEGATIVE
Glucose, UA: NEGATIVE mg/dL
Hgb urine dipstick: NEGATIVE
Ketones, ur: NEGATIVE mg/dL
Nitrite: NEGATIVE
Protein, ur: NEGATIVE mg/dL
Specific Gravity, Urine: 1.012 (ref 1.005–1.030)
pH: 6 (ref 5.0–8.0)

## 2018-09-10 LAB — CBC WITH DIFFERENTIAL/PLATELET
Abs Immature Granulocytes: 0.03 10*3/uL (ref 0.00–0.07)
Basophils Absolute: 0.1 10*3/uL (ref 0.0–0.1)
Basophils Relative: 1 %
Eosinophils Absolute: 0 10*3/uL (ref 0.0–0.5)
Eosinophils Relative: 0 %
HCT: 45.2 % (ref 36.0–46.0)
Hemoglobin: 14.7 g/dL (ref 12.0–15.0)
Immature Granulocytes: 0 %
Lymphocytes Relative: 23 %
Lymphs Abs: 1.8 10*3/uL (ref 0.7–4.0)
MCH: 29.2 pg (ref 26.0–34.0)
MCHC: 32.5 g/dL (ref 30.0–36.0)
MCV: 89.7 fL (ref 80.0–100.0)
Monocytes Absolute: 0.4 10*3/uL (ref 0.1–1.0)
Monocytes Relative: 5 %
Neutro Abs: 5.5 10*3/uL (ref 1.7–7.7)
Neutrophils Relative %: 71 %
Platelets: 292 10*3/uL (ref 150–400)
RBC: 5.04 MIL/uL (ref 3.87–5.11)
RDW: 13.3 % (ref 11.5–15.5)
WBC: 7.8 10*3/uL (ref 4.0–10.5)
nRBC: 0 % (ref 0.0–0.2)

## 2018-09-10 LAB — TROPONIN I: Troponin I: <0.03 ng/mL (ref <0.03)

## 2018-09-10 NOTE — ED Triage Notes (Signed)
Pt feeling generalized weakness since Tuesday. Pt states she has felt this way off and on for approximately 9 weeks.

## 2018-09-10 NOTE — ED Provider Notes (Signed)
[facility] EMERGENCY DEPARTMENT Provider Note   CSN: [id number] Arrival date & time: 09/10/18  1244    History   Chief Complaint Chief Complaint  Patient presents with  . Weakness    HPI Leah Henson is a 53 y.o. female.     HPI  Pt was seen at 1255. Per pt, c/o gradual onset and persistence of generalized weakness for the past 1 week. Pt states "it all started 9 weeks ago" and "my doctor started me on an anti-inflammatory (relafen) after I took a fall." Pt states she has been to her PMD multiple times for this complaint: has been started on BP meds, then had her BP meds dose cut, was told she "was anxious" as the cause for her BP elevation, and was started on zoloft and ativan for her anxiety (approximately 1 week ago when she noticed the generalized weakness "more"). Pt endorses anxiety and "panic attack" earlier today. Denies fevers, cough, known COVID+ exposure. Denies CP/palpitations, no SOB/cough, no abd pain, no N/V/D, no back or neck pain, no visual changes, no focal motor weakness, no tingling/numbness in extremities, no ataxia, no slurred speech, no facial droop.    Past Medical History:  Diagnosis Date  . Anxiety   . GERD (gastroesophageal reflux disease) 05/01/2010 OV  . History of cardiac catheterization 2008   normal coronary arteries  . Hyperlipidemia 05/01/2010 OV  . Hypertension   . Obesity (BMI 30-39.9) 05/01/2008 OV  . Seasonal allergies   . TIA (transient ischemic attack)     Patient Active Problem List   Diagnosis Date Noted  . Encounter for screening colonoscopy 07/28/2017  . At risk for polypharmacy 07/28/2017  . Altered mental status 07/26/2013  . Slurred speech 07/26/2013  . Lethargy 07/26/2013  . Bradycardia 07/26/2013  . UTI (lower urinary tract infection) 07/26/2013  . Anxiety 03/10/2013  . Hyperlipidemia 03/10/2013  . Atypical chest pain 03/10/2013  . GERD (gastroesophageal reflux disease) 03/10/2013  . Mild obesity 03/10/2013  .  Contusion of leg 01/12/2012  . KNEE PAIN 05/15/2007    Past Surgical History:  Procedure Laterality Date  . ABDOMINAL HYSTERECTOMY  2006  . CARDIAC CATHETERIZATION  06/13/2006  . COLONOSCOPY WITH PROPOFOL N/A 09/29/2017   Procedure: COLONOSCOPY WITH PROPOFOL;  Surgeon: Corbin Ade, MD;  Location: [facility] SUITE;  Service: Endoscopy;  Laterality: N/A;  11:00am  . coma  1980's   for seven days  . TONSILLECTOMY       OB History   No obstetric history on file.      Home Medications    Prior to Admission medications   Medication Sig Start Date End Date Taking? Authorizing Provider  aspirin EC 81 MG tablet Take 81 mg by mouth 3 (three) times a week.     [provider]  Carboxymethylcellul-Glycerin (REFRESH OPTIVE OP) Place 1 drop into both eyes 3 (three) times daily as needed (for dry/irritated eyes.).     [provider]  fexofenadine (ALLEGRA) 180 MG tablet Take 180 mg by mouth daily as needed for allergies.    [provider]  ibuprofen (ADVIL,MOTRIN) 800 MG tablet Take 800 mg by mouth every 8 (eight) hours as needed (FOR PAIN.).    [provider]  LORazepam (ATIVAN) 0.5 MG tablet Take 0.5 mg by mouth 2 (two) times daily as needed (for anxiety).  04/29/17   [provider]  polyethylene glycol-electrolytes (TRILYTE) 420 g solution Take 4,000 mLs by mouth as directed. 07/28/17   Rourk,  Gerrit Friendsobert M, MD  sertraline (ZOLOFT) 50 MG tablet Take 50 mg by mouth daily. 09/02/17   [provider]    Family History Family History  Problem Relation Age of Onset  . Scleroderma Mother   . Heart disease Mother   . Heart disease Father   . Breast cancer Sister   . Colon cancer Neg Hx     Social History Social History   Tobacco Use  . Smoking status: Never Smoker  . Smokeless tobacco: Never Used  Substance Use Topics  . Alcohol use: Yes    Comment: occasional  . Drug use: No     Allergies   Penicillins and  Hydrocodone-acetaminophen   Review of Systems Review of Systems ROS: Statement: All systems negative except as marked or noted in the HPI; Constitutional: Negative for fever and chills. +fatigue/generalized weakness.; ; Eyes: Negative for eye pain, redness and discharge. ; ; ENMT: Negative for ear pain, hoarseness, nasal congestion, sinus pressure and sore throat. ; ; Cardiovascular: Negative for chest pain, palpitations, diaphoresis, dyspnea and peripheral edema. ; ; Respiratory: Negative for cough, wheezing and stridor. ; ; Gastrointestinal: Negative for nausea, vomiting, diarrhea, abdominal pain, blood in stool, hematemesis, jaundice and rectal bleeding. . ; ; Genitourinary: Negative for dysuria, flank pain and hematuria. ; ; Musculoskeletal: Negative for back pain and neck pain. Negative for swelling and trauma.; ; Skin: Negative for pruritus, rash, abrasions, blisters, bruising and skin lesion.; ; Neuro: Negative for headache, lightheadedness and neck stiffness. Negative for altered level of consciousness, altered mental status, extremity weakness, paresthesias, involuntary movement, seizure and syncope.       Physical Exam Updated Vital Signs BP (!) 162/84 (BP Location: Right Arm)   Pulse 71   Temp 97.7 F (36.5 C) (Oral)   Resp 18   Ht 5' 4.5" (1.638 m)   Wt 92.1 kg   SpO2 99%   BMI 34.31 kg/m    3:09:44 Orthostatic Vital Signs EW  Orthostatic Lying   BP- Lying: 138/67  Pulse- Lying: 73      Orthostatic Sitting  BP- Sitting: 132/75  Pulse- Sitting: 74      Orthostatic Standing at 0 minutes  BP- Standing at 0 minutes: 137/78  Pulse- Standing at 0 minutes: 78     Physical Exam 1300: Physical examination:  Nursing notes reviewed; Vital signs and O2 SAT reviewed;  Constitutional: Well developed, Well nourished, Well hydrated, In no acute distress; Head:  Normocephalic, atraumatic; Eyes: EOMI, PERRL, No scleral icterus; ENMT: Mouth and pharynx normal, Mucous membranes  moist; Neck: Supple, Full range of motion, No lymphadenopathy; Cardiovascular: Regular rate and rhythm, No  gallop; Respiratory: Breath sounds clear & equal bilaterally, No wheezes.  Speaking full sentences with ease, Normal respiratory effort/excursion; Chest: Nontender, Movement normal; Abdomen: Soft, Nontender, Nondistended, Normal bowel sounds; Genitourinary: No CVA tenderness; Extremities: Peripheral pulses normal, No tenderness, No edema, No calf edema or asymmetry.; Neuro: AA&Ox3, Major CN grossly intact. No speech.  Speech clear. No gross focal motor or sensory deficits in extremities. Climbs on and off stretcher easily by herself. Gait steady..; Skin: Color normal, Warm, Dry.; Psych:  Anxious.    ED Treatments / Results  Labs (all labs ordered are listed, but only abnormal results are displayed)   EKG EKG Interpretation  Date/Time:  Sunday Sep 10 2018 13:11:02 EDT Ventricular Rate:  78 PR Interval:    QRS Duration: 96 QT Interval:  400 QTC Calculation: 456 R Axis:   46 Text Interpretation:  Sinus  rhythm Low voltage, precordial leads When compared with ECG of 07/26/2013 Rate faster Otherwise no significant change Confirmed by Samuel Jester [phone]) on 09/10/2018 1:17:33 PM   Radiology   Procedures Procedures (including critical care time)  Medications Ordered in ED Medications - No data to display   Initial Impression / Assessment and Plan / ED Course  I have reviewed the triage vital signs and the nursing notes.  Pertinent labs & imaging results that were available during my care of the patient were reviewed by me and considered in my medical decision making (see chart for details).       MDM Reviewed: previous chart, nursing note and vitals Reviewed previous: labs and ECG Interpretation: labs, ECG, x-ray and CT scan    Results for orders placed or performed during the hospital encounter of 09/10/18  Comprehensive metabolic panel  Result Value Ref Range    Sodium 141 135 - 145 mmol/L   Potassium 4.0 3.5 - 5.1 mmol/L   Chloride 110 98 - 111 mmol/L   CO2 20 (L) 22 - 32 mmol/L   Glucose, Bld 118 (H) 70 - 99 mg/dL   BUN 11 6 - 20 mg/dL   Creatinine, Ser 6.04 0.44 - 1.00 mg/dL   Calcium 9.3 8.9 - 54.0 mg/dL   Total Protein 7.5 6.5 - 8.1 g/dL   Albumin 4.5 3.5 - 5.0 g/dL   AST 28 15 - 41 U/L   ALT 37 0 - 44 U/L   Alkaline Phosphatase 85 38 - 126 U/L   Total Bilirubin 0.3 0.3 - 1.2 mg/dL   GFR calc non Af Amer >60 >60 mL/min   GFR calc Af Amer >60 >60 mL/min   Anion gap 11 5 - 15  Troponin I - Once  Result Value Ref Range   Troponin I <0.03 <0.03 ng/mL  CBC with Differential  Result Value Ref Range   WBC 7.8 4.0 - 10.5 K/uL   RBC 5.04 3.87 - 5.11 MIL/uL   Hemoglobin 14.7 12.0 - 15.0 g/dL   HCT 98.1 19.1 - 47.8 %   MCV 89.7 80.0 - 100.0 fL   MCH 29.2 26.0 - 34.0 pg   MCHC 32.5 30.0 - 36.0 g/dL   RDW 29.5 62.1 - 30.8 %   Platelets 292 150 - 400 K/uL   nRBC 0.0 0.0 - 0.2 %   Neutrophils Relative % 71 %   Neutro Abs 5.5 1.7 - 7.7 K/uL   Lymphocytes Relative 23 %   Lymphs Abs 1.8 0.7 - 4.0 K/uL   Monocytes Relative 5 %   Monocytes Absolute 0.4 0.1 - 1.0 K/uL   Eosinophils Relative 0 %   Eosinophils Absolute 0.0 0.0 - 0.5 K/uL   Basophils Relative 1 %   Basophils Absolute 0.1 0.0 - 0.1 K/uL   Immature Granulocytes 0 %   Abs Immature Granulocytes 0.03 0.00 - 0.07 K/uL  Urinalysis, Routine w reflex microscopic  Result Value Ref Range   Color, Urine YELLOW YELLOW   APPearance CLEAR CLEAR   Specific Gravity, Urine 1.012 1.005 - 1.030   pH 6.0 5.0 - 8.0   Glucose, UA NEGATIVE NEGATIVE mg/dL   Hgb urine dipstick NEGATIVE NEGATIVE   Bilirubin Urine NEGATIVE NEGATIVE   Ketones, ur NEGATIVE NEGATIVE mg/dL   Protein, ur NEGATIVE NEGATIVE mg/dL   Nitrite NEGATIVE NEGATIVE   Leukocytes,Ua SMALL (A) NEGATIVE   RBC / HPF 0-5 0 - 5 RBC/hpf   WBC, UA 0-5 0 - 5 WBC/hpf  Bacteria, UA NONE SEEN NONE SEEN   Squamous Epithelial / LPF 0-5 0 -  5   Mucus PRESENT    Dg Chest 2 View Result Date: 09/10/2018 CLINICAL DATA:  Generalized weakness for 6 days. EXAM: CHEST - 2 VIEW COMPARISON:  PA and lateral chest 07/26/2013.  CT chest 06/11/2006. FINDINGS: Lungs clear. Heart size normal. No pneumothorax or pleural fluid. No acute or focal bony abnormality. IMPRESSION: Negative chest. Electronically Signed   By: Drusilla Kanner M.D.   On: 09/10/2018 14:10   Ct Head Wo Contrast Result Date: 09/10/2018 CLINICAL DATA:  Generalized weakness beginning 4 days ago. EXAM: CT HEAD WITHOUT CONTRAST TECHNIQUE: Contiguous axial images were obtained from the base of the skull through the vertex without intravenous contrast. COMPARISON:  07/26/2013 FINDINGS: Brain: The brain shows a normal appearance without evidence of malformation, atrophy, old or acute small or large vessel infarction, mass lesion, hemorrhage, hydrocephalus or extra-axial collection. Vascular: No hyperdense vessel. No evidence of atherosclerotic calcification. Skull: Normal.  No traumatic finding.  No focal bone lesion. Sinuses/Orbits: Sinuses are clear. Orbits appear normal. Mastoids are clear. Other: None significant IMPRESSION: Normal head CT Electronically Signed   By: Paulina Fusi M.D.   On: 09/10/2018 14:19   JOLANA HASSER was evaluated in Emergency Department on 09/10/2018 for the symptoms described in the history of present illness. She was evaluated in the context of the global COVID-19 pandemic, which necessitated consideration that the patient might be at risk for infection with the SARS-CoV-2 virus that causes COVID-19. Institutional protocols and algorithms that pertain to the evaluation of patients at risk for COVID-19 are in a state of rapid change based on information released by regulatory bodies including the CDC and federal and state organizations. These policies and algorithms were followed during the patient's care in the ED.    1440:  Pt not orthostatic on VS. Pt has  ambulated while in the ED, gait steady, resps easy, NAD. Neuro exam remains intact/unchanged. Workup reassuring. No clear indication for admission at this time. Dx and testing, d/w pt.  Questions answered.  Verb understanding, agreeable to d/c home with outpt f/u.     Final Clinical Impressions(s) / ED Diagnoses   Final diagnoses:  None    ED Discharge Orders    None       Samuel Jester, DO 09/14/18 1744

## 2018-09-10 NOTE — ED Notes (Signed)
Pt back from xray and CT

## 2018-09-10 NOTE — Discharge Instructions (Signed)
Take your usual prescriptions as previously directed. Keep a diary of your symptoms as related to your medications, sleep, etc.  Call your regular medical doctor tomorrow to schedule a follow up appointment within the week.  Return to the Emergency Department immediately sooner if worsening.

## 2018-09-11 LAB — URINE CULTURE: Culture: <10000 — AB

## 2019-03-01 ENCOUNTER — Other Ambulatory Visit: Payer: Self-pay

## 2019-03-01 DIAGNOSIS — Z20822 Contact with and (suspected) exposure to covid-19: Secondary | ICD-10-CM

## 2019-03-04 LAB — NOVEL CORONAVIRUS, NAA: SARS-CoV-2, NAA: DETECTED — AB

## 2020-06-30 IMAGING — CT CT HEAD WITHOUT CONTRAST
3 series · 16 of 47 positions shown, 19 images · non-contrast
Comparison: 07/26/2013

CLINICAL DATA: Generalized weakness beginning 4 days ago.

EXAM:
CT HEAD WITHOUT CONTRAST
TECHNIQUE: Contiguous axial images were obtained from the base of the skull
through the vertex without intravenous contrast.

[Series 2: head w o · axial · 0.40mm/px · z∈[+12,+137]mm · 10 of 31 slices shown, 13 images]
[im 3/31  brain]
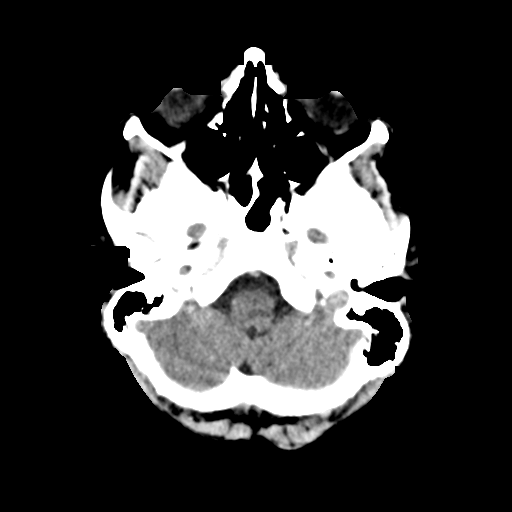
[im 3/31  bone]
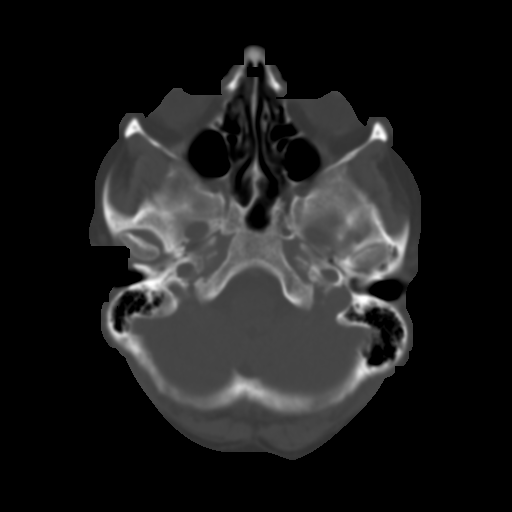
[im 6/31  brain]
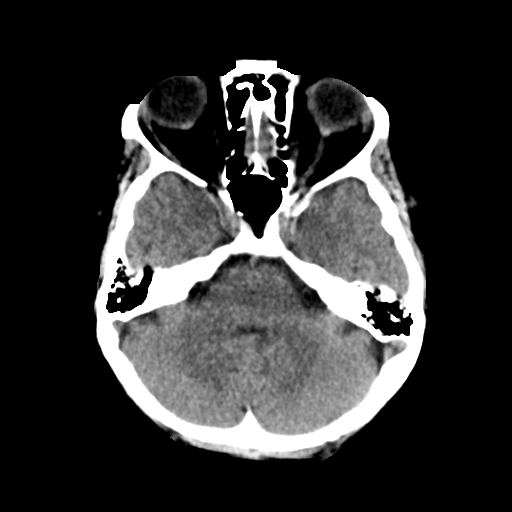
[im 9/31  brain]
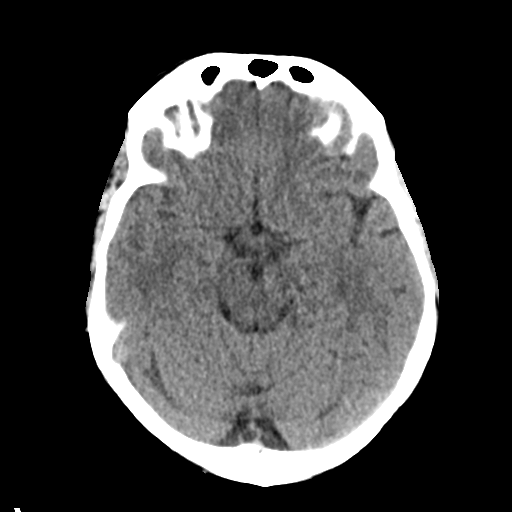
[im 11/31  brain]
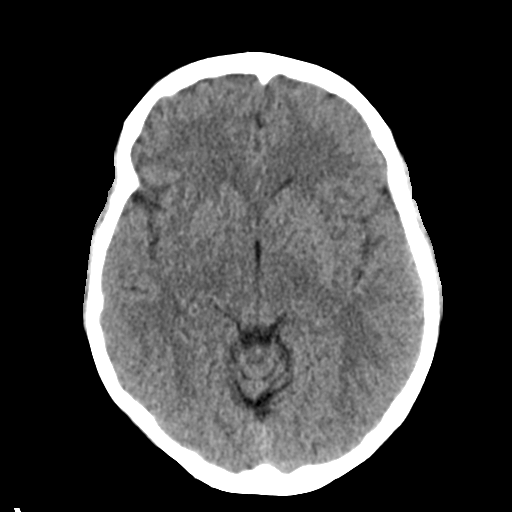
[im 14/31  brain]
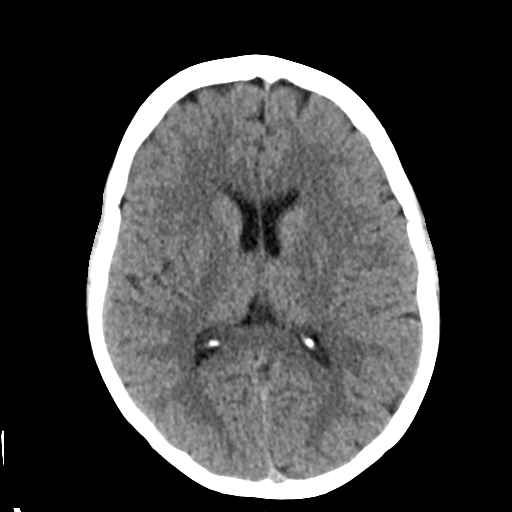
[im 14/31  bone]
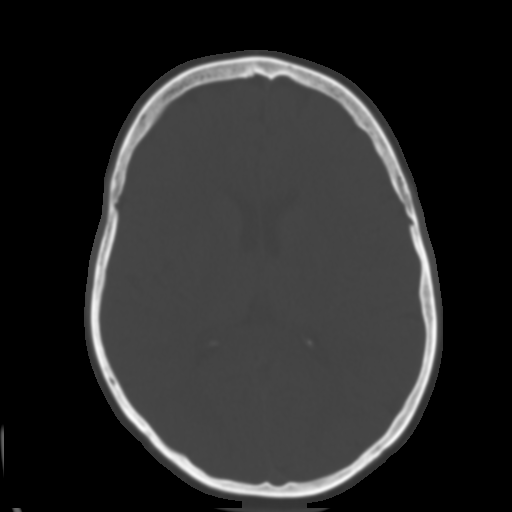
[im 17/31  brain]
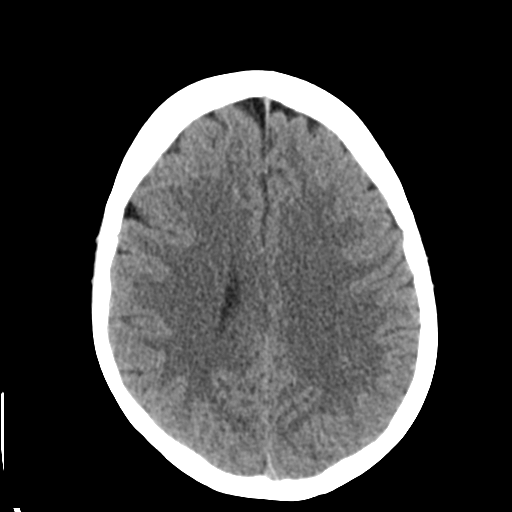
[im 20/31  brain]
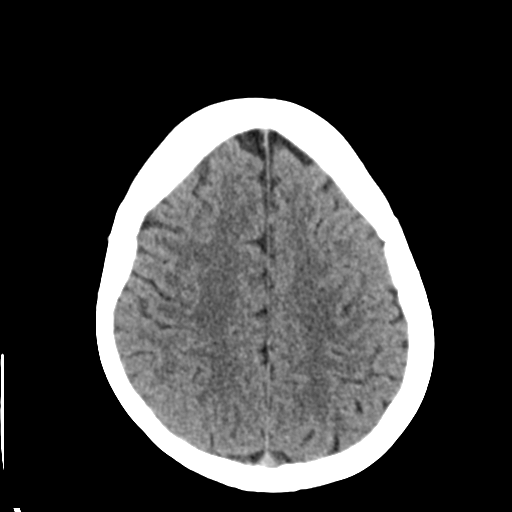
[im 23/31  brain]
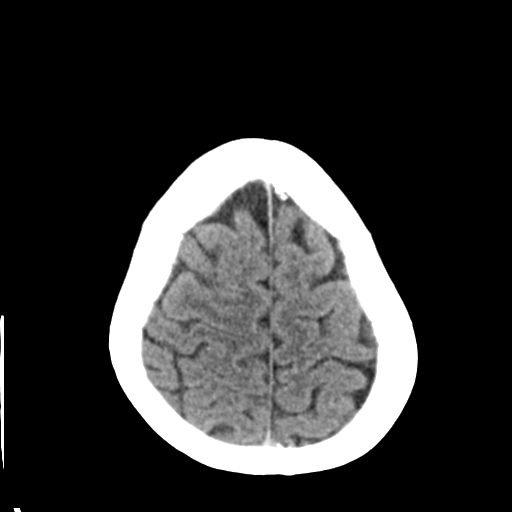
[im 25/31  brain]
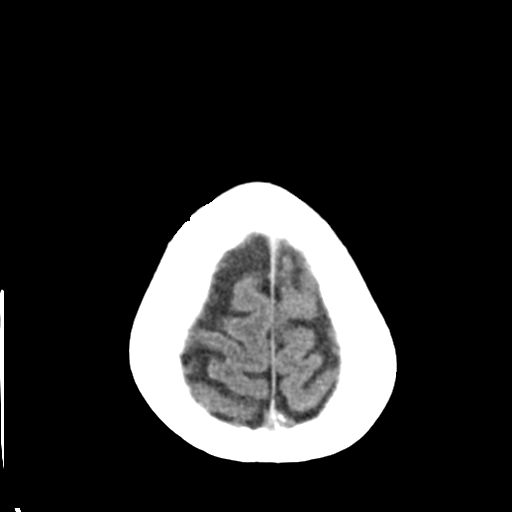
[im 25/31  bone]
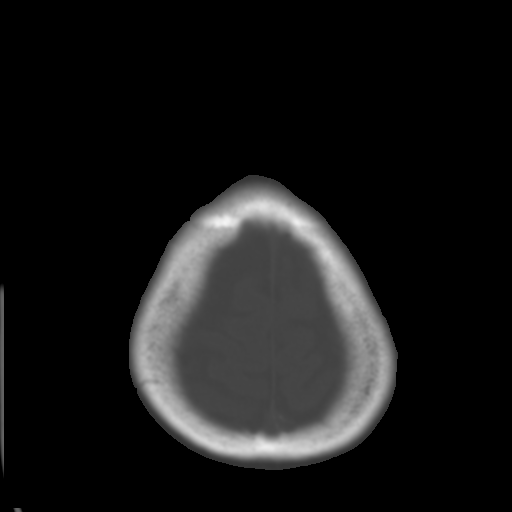
[im 28/31  brain]
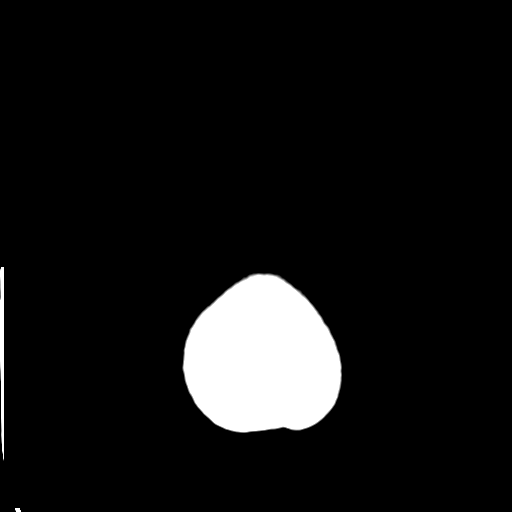

[Series 4: coronal soft · coronal · 0.31mm/px · 3 of 65 slices shown]
[im 22/65  brain]
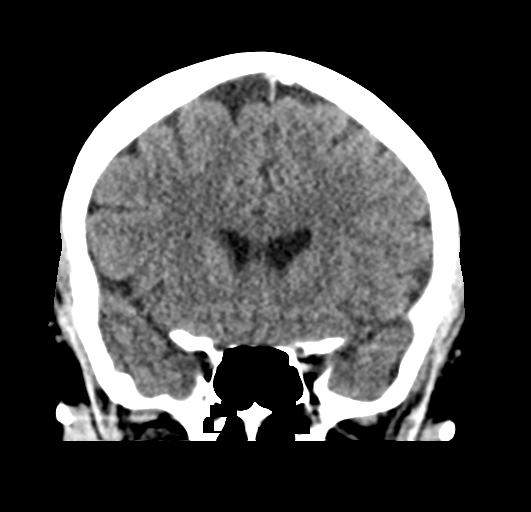
[im 29/65  brain]
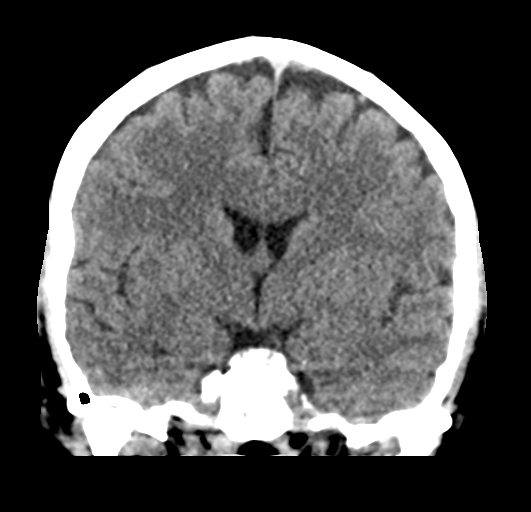
[im 36/65  brain]
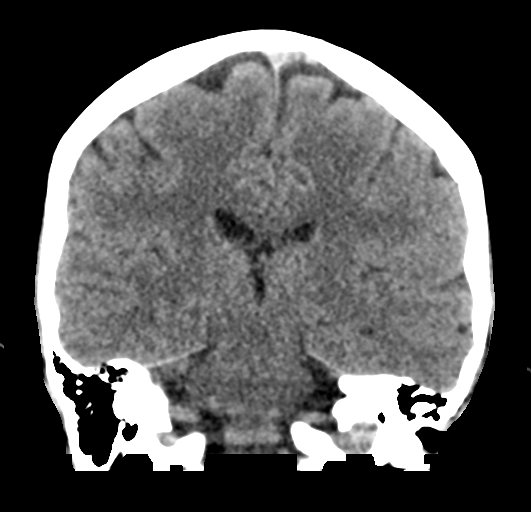

[Series 5: sagittal soft · sagittal · 0.35mm/px · 3 of 61 slices shown]
[im 21/61  brain]
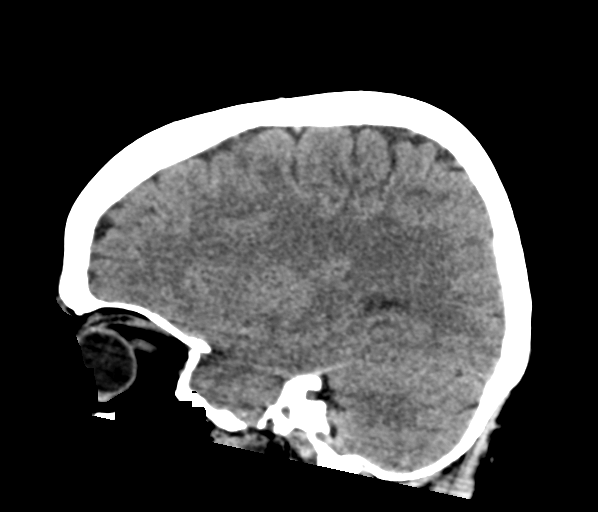
[im 31/61  brain]
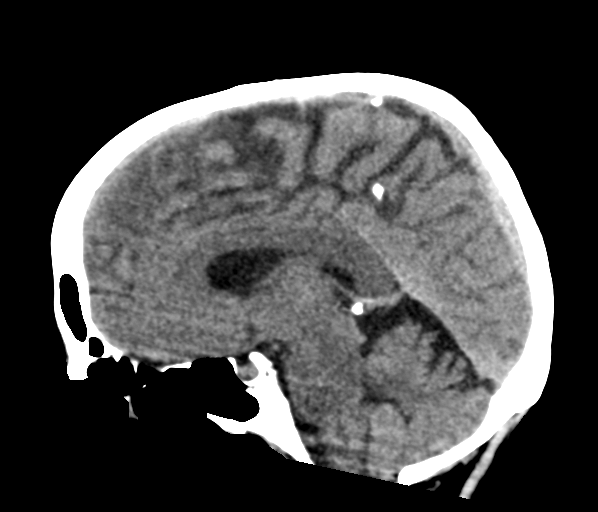
[im 41/61  brain]
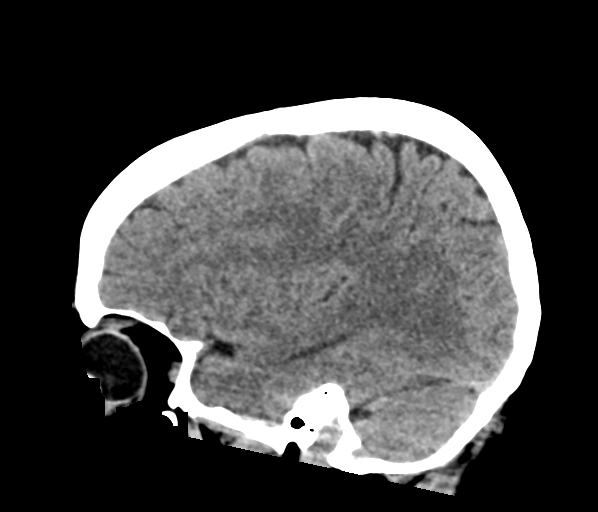

[16 of 47 positions shown; findings below may reference images not displayed]

FINDINGS: Brain: The brain shows a normal appearance without evidence of
malformation, atrophy, old or acute small or large vessel
infarction, mass lesion, hemorrhage, hydrocephalus or extra-axial
collection.

Vascular: No hyperdense vessel. No evidence of atherosclerotic
calcification.

Skull: Normal.  No traumatic finding.  No focal bone lesion.

Sinuses/Orbits: Sinuses are clear. Orbits appear normal. Mastoids
are clear.

Other: None significant
IMPRESSION: Normal head CT

## 2020-06-30 IMAGING — DX CHEST - 2 VIEW
2 series · 2 of 2 positions shown · non-contrast
Comparison: PA and lateral chest 07/26/2013.  CT chest 06/11/2006.

CLINICAL DATA: Generalized weakness for 6 days.

EXAM:
CHEST - 2 VIEW

[chest pa]
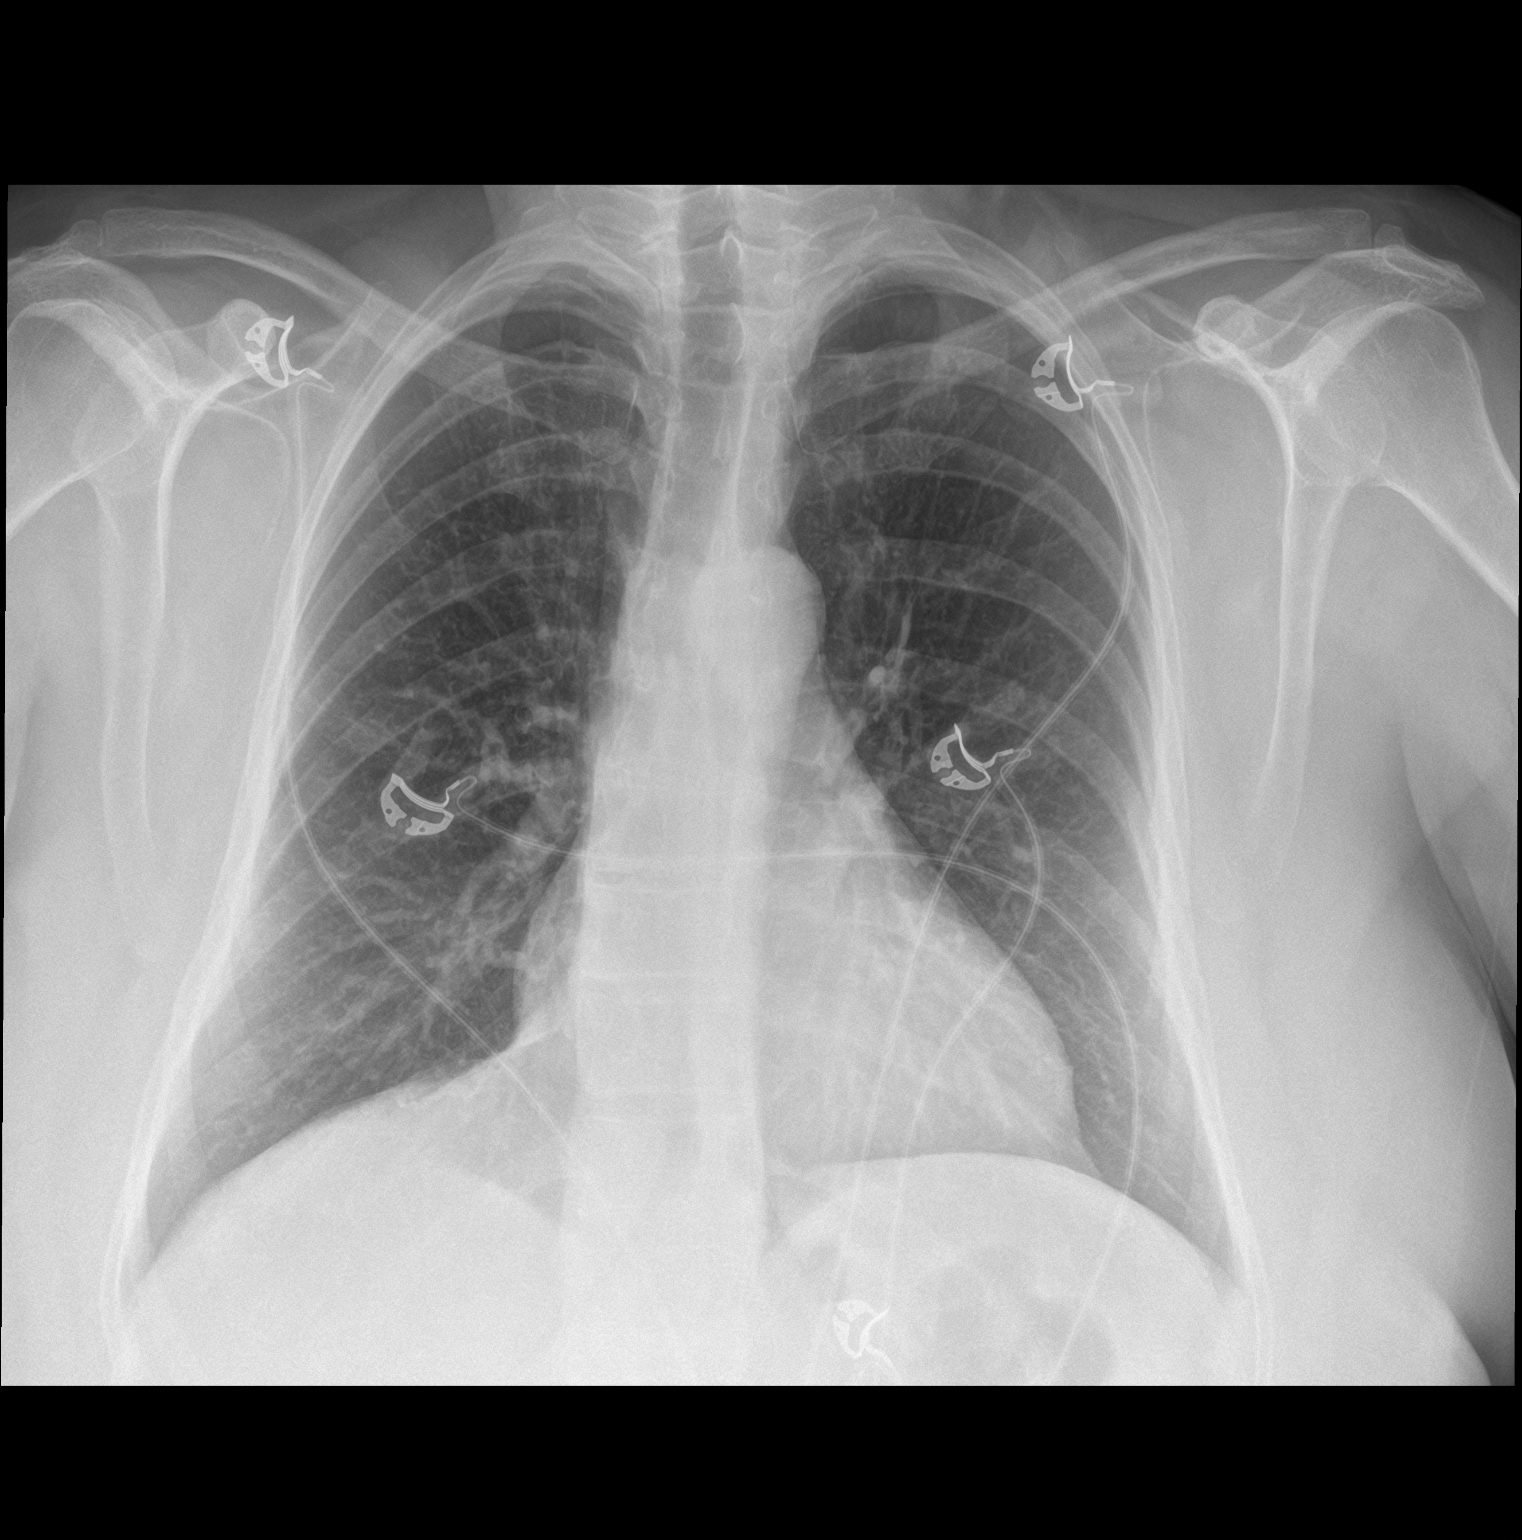

[chest lat]
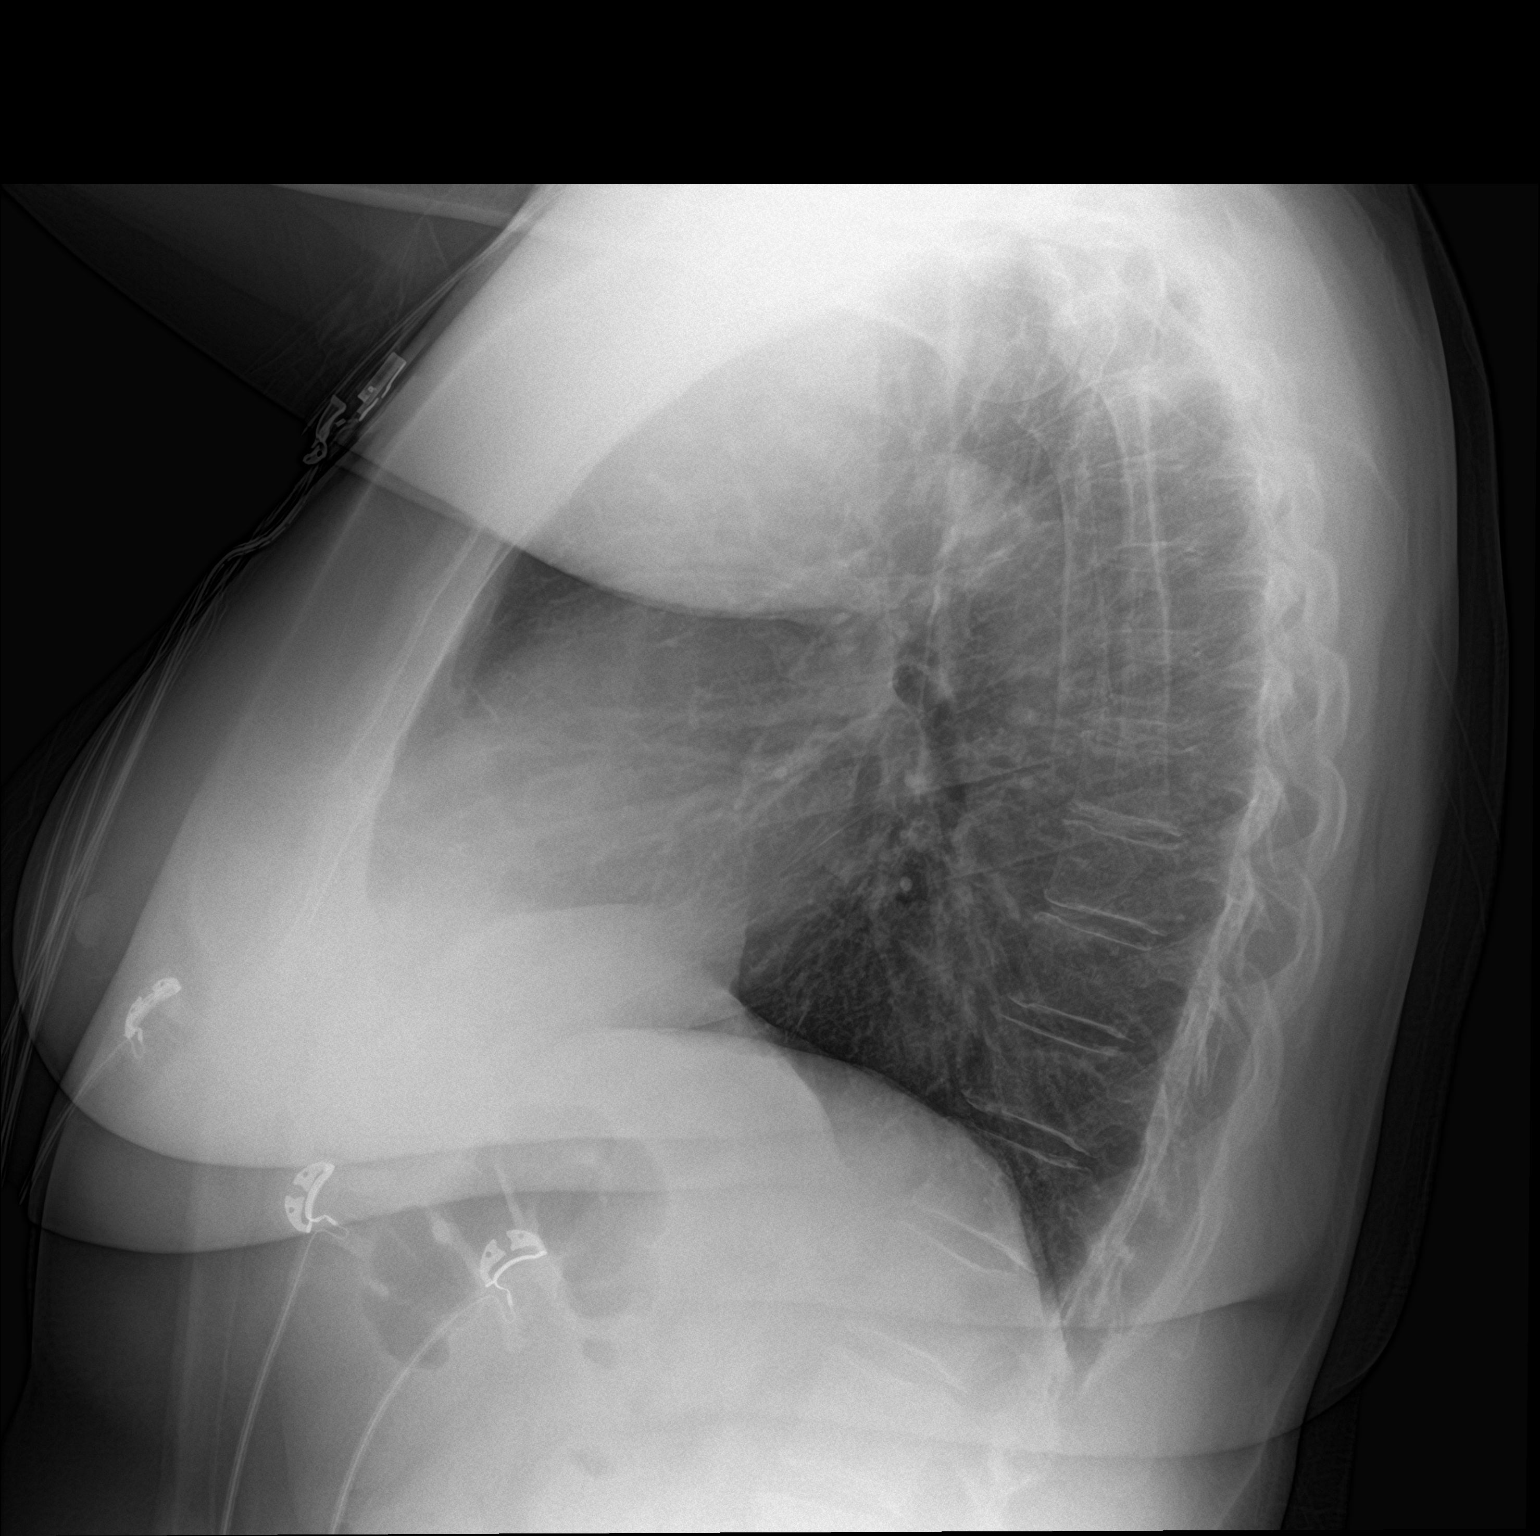

[2 of 2 positions shown; findings below may reference images not displayed]

FINDINGS: Lungs clear. Heart size normal. No pneumothorax or pleural fluid. No
acute or focal bony abnormality.
IMPRESSION: Negative chest.
